# Patient Record
Sex: Female | Born: 1990
Health system: Southern US, Community
[De-identification: ages and names within clinical notes are randomized; demographics above are authoritative.]

## PROBLEM LIST (undated history)

## (undated) DIAGNOSIS — K5900 Constipation, unspecified: Secondary | ICD-10-CM

## (undated) DIAGNOSIS — O24419 Gestational diabetes mellitus in pregnancy, unspecified control: Secondary | ICD-10-CM

## (undated) HISTORY — PX: NO PAST SURGERIES: SHX2092

## (undated) HISTORY — DX: Gestational diabetes mellitus in pregnancy, unspecified control: O24.419

---

## 2020-08-01 ENCOUNTER — Other Ambulatory Visit: Payer: Self-pay

## 2020-08-01 ENCOUNTER — Encounter (HOSPITAL_COMMUNITY): Payer: Self-pay

## 2020-08-01 ENCOUNTER — Ambulatory Visit (HOSPITAL_COMMUNITY)
Admission: EM | Admit: 2020-08-01 | Discharge: 2020-08-01 | Disposition: A | Discharge status: Home / Self Care | Payer: Medicaid Other | Attending: Family Medicine | Admitting: Family Medicine

## 2020-08-01 DIAGNOSIS — K0889 Other specified disorders of teeth and supporting structures: Secondary | ICD-10-CM | POA: Diagnosis not present

## 2020-08-01 HISTORY — DX: Constipation, unspecified: K59.00

## 2020-08-01 MED ORDER — PENICILLIN V POTASSIUM 500 MG PO TABS: 500.0000 mg | ORAL_TABLET | Freq: Three times a day (TID) | ORAL | 0 refills | Status: DC

## 2020-08-01 MED ORDER — HYDROCODONE-ACETAMINOPHEN 5-325 MG PO TABS
1.0000 | ORAL_TABLET | Freq: Four times a day (QID) | ORAL | 0 refills | Status: DC | PRN
Start: 2020-08-01 — End: 2021-02-19

## 2020-08-01 NOTE — Discharge Instructions (Addendum)
Continue taking ibuprofen.  Be aware, you have been prescribed pain medications that may cause drowsiness. While taking this medication, do not take any other medications containing acetaminophen (Tylenol). Do not combine with alcohol or other illicit drugs. Please do not drive, operate heavy machinery, or take part in activities that require making important decisions while on this medication as your judgement may be clouded.

## 2020-08-01 NOTE — ED Provider Notes (Signed)
[facility]   [id number] 08/01/20 Arrival Time: 1034  ASSESSMENT & PLAN:  1. Pain, dental     No sign of abscess requiring I&D at this time. Discussed.   Meds ordered this encounter  Medications  . HYDROcodone-acetaminophen (NORCO/VICODIN) 5-325 MG tablet    Sig: Take 1 tablet by mouth every 6 (six) hours as needed for moderate pain or severe pain.    Dispense:  8 tablet    Refill:  0  . penicillin v potassium (VEETID) 500 MG tablet    Sig: Take 1 tablet (500 mg total) by mouth 3 (three) times daily.    Dispense:  30 tablet    Refill:  0     Controlled Substances Registry consulted for this patient. I feel the risk/benefit ratio today is favorable for proceeding with this prescription for a controlled substance. Medication sedation precautions given.  Reports she has a dentist appt next week.  Reviewed expectations re: course of current medical issues. Questions answered. Outlined signs and symptoms indicating need for more acute intervention. Patient verbalized understanding. After Visit Summary given.   SUBJECTIVE:  Dawn Kennedy is a 29 y.o. female who reports gradual onset of left lower rear dental pain described as "very sore". Present for weeks. Fever: absent. Tolerating PO intake but reports pain with chewing. Normal swallowing. She does not see a dentist regularly. No neck swelling or pain. OTC analgesics without relief.  OBJECTIVE: Vitals:   08/01/20 1138 08/01/20 1142  BP:  101/77  Pulse:  74  Resp:  15  Temp:  98.2 F (36.8 C)  TempSrc:  Oral  SpO2:  97%  Weight: 60 kg   Height: 5' 1.02" (1.55 m)     General appearance: alert; no distress HENT: normocephalic; atraumatic; dentition: fair; left lower rear gums without areas of fluctuance, drainage, or bleeding and with tenderness to palpation; cracked molar; normal jaw movement without difficulty Neck: supple without LAD; FROM; trachea midline Lungs: normal respirations; unlabored; speaks  full sentences without difficulty Skin: warm and dry Psychological: alert and cooperative; normal mood and affect  No Known Allergies  Past Medical History:  Diagnosis Date  . Constipated    Social History   Socioeconomic History  . Marital status: Married    Spouse name: Not on file  . Number of children: Not on file  . Years of education: Not on file  . Highest education level: Not on file  Occupational History  . Not on file  Tobacco Use  . Smoking status: Never Smoker  . Smokeless tobacco: Never Used  Substance and Sexual Activity  . Alcohol use: Never  . Drug use: Never  . Sexual activity: Not on file  Other Topics Concern  . Not on file  Social History Narrative  . Not on file   Social Determinants of Health   Financial Resource Strain:   . Difficulty of Paying Living Expenses: Not on file  Food Insecurity:   . Worried About Programme researcher, broadcasting/film/video in the Last Year: Not on file  . Ran Out of Food in the Last Year: Not on file  Transportation Needs:   . Lack of Transportation (Medical): Not on file  . Lack of Transportation (Non-Medical): Not on file  Physical Activity:   . Days of Exercise per Week: Not on file  . Minutes of Exercise per Session: Not on file  Stress:   . Feeling of Stress : Not on file  Social Connections:   . Frequency of  Communication with Friends and Family: Not on file  . Frequency of Social Gatherings with Friends and Family: Not on file  . Attends Religious Services: Not on file  . Active Member of Clubs or Organizations: Not on file  . Attends Banker Meetings: Not on file  . Marital Status: Not on file  Intimate Partner Violence:   . Fear of Current or Ex-Partner: Not on file  . Emotionally Abused: Not on file  . Physically Abused: Not on file  . Sexually Abused: Not on file   No family history on file.    Mardella Layman, MD 08/01/20 1331

## 2020-08-01 NOTE — ED Triage Notes (Signed)
Pt presents for multiple tooth aches (possibly impacted wisdom teeth). She recently came to the [place] from [place] and has not established herself with a Education officer, community, she states her case worker is trying to set up an appointment.

## 2020-09-10 ENCOUNTER — Other Ambulatory Visit: Payer: Self-pay | Admitting: Obstetrics and Gynecology

## 2020-09-10 ENCOUNTER — Ambulatory Visit
Admission: RE | Admit: 2020-09-10 | Discharge: 2020-09-10 | Disposition: A | Discharge status: Home / Self Care | Payer: No Typology Code available for payment source | Source: Ambulatory Visit | Attending: Obstetrics and Gynecology | Admitting: Obstetrics and Gynecology

## 2020-09-10 DIAGNOSIS — R7612 Nonspecific reaction to cell mediated immunity measurement of gamma interferon antigen response without active tuberculosis: Secondary | ICD-10-CM

## 2020-11-14 ENCOUNTER — Ambulatory Visit: Payer: No Typology Code available for payment source | Admitting: Family

## 2020-12-17 DIAGNOSIS — Z227 Latent tuberculosis: Secondary | ICD-10-CM

## 2020-12-17 DIAGNOSIS — Z0289 Encounter for other administrative examinations: Secondary | ICD-10-CM

## 2020-12-17 HISTORY — DX: Encounter for other administrative examinations: Z02.89

## 2020-12-17 HISTORY — DX: Latent tuberculosis: Z22.7

## 2020-12-17 NOTE — Progress Notes (Unsigned)
  Patient Name: Dawn Kennedy Date of Birth: 05-Nov-1990 Date of Visit: 12/17/20 PCP: Pcp, No  Chief Complaint: refugee intake examination and ***   The patient's preferred language is Dari. An interpreter was used for the entire visit.  Interpreter Name or ID: ***    Subjective: Dawn Kennedy is a pleasant 30 y.o. presenting today for an initial refugee and immigrant clinic visit.    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  PSH: ***   FH: ***  Allergies:  ***   Current Medications:  ***   Social History: Tobacco Use: *** Alcohol Use: ***  In the past two weeks, have you run out of food before you had money to purchase more? ***  In the past two weeks, have you had difficulty with obtaining food for your family? ***  Refugee Information     Overseas Vaccines Reviewed and Updated in Epic {yes/no:20286}   There were no vitals filed for this visit. HEENT: Sclera anicteric. Dentition is *** . Appears well hydrated. Neck: Supple Cardiac: Regular rate and rhythm. Normal S1/S2. No murmurs, rubs, or gallops appreciated. Lungs: Clear bilaterally to ascultation.  Abdomen: Normoactive bowel sounds. No tenderness to deep or light palpation. No rebound or guarding. Splenomegaly ***  Extremities: Warm, well perfused without edema.  Skin: ***  Psych: Pleasant and appropriate  MSK: ***   No problem-specific Assessment & Plan notes found for this encounter.    I have personally updated the history tabs within Epic and included the refugee information in social documentation. ***   Music therapist signed with agency ***.   Release of information signed for Health Department ***.   Return to care in 1 month in [facility] with resident physician and PCP ***.   Vaccines: ***

## 2020-12-18 ENCOUNTER — Other Ambulatory Visit (HOSPITAL_COMMUNITY): Payer: Self-pay | Admitting: Family Medicine

## 2020-12-18 ENCOUNTER — Ambulatory Visit (INDEPENDENT_AMBULATORY_CARE_PROVIDER_SITE_OTHER): Payer: Medicaid Other | Admitting: Family Medicine

## 2020-12-18 ENCOUNTER — Other Ambulatory Visit (HOSPITAL_COMMUNITY)
Admission: RE | Admit: 2020-12-18 | Discharge: 2020-12-18 | Disposition: A | Discharge status: Home / Self Care | Payer: Medicaid Other | Source: Ambulatory Visit | Attending: Family Medicine | Admitting: Family Medicine

## 2020-12-18 ENCOUNTER — Other Ambulatory Visit: Payer: Self-pay

## 2020-12-18 DIAGNOSIS — Z227 Latent tuberculosis: Secondary | ICD-10-CM

## 2020-12-18 DIAGNOSIS — K5900 Constipation, unspecified: Secondary | ICD-10-CM | POA: Diagnosis not present

## 2020-12-18 DIAGNOSIS — Z0289 Encounter for other administrative examinations: Secondary | ICD-10-CM

## 2020-12-18 LAB — POCT URINALYSIS DIP (MANUAL ENTRY)
Bilirubin, UA: NEGATIVE
Glucose, UA: NEGATIVE mg/dL
Ketones, POC UA: NEGATIVE mg/dL
Nitrite, UA: NEGATIVE
Protein Ur, POC: NEGATIVE mg/dL
Spec Grav, UA: 1.025 (ref 1.010–1.025)
Urobilinogen, UA: 0.2 E.U./dL
pH, UA: 7 (ref 5.0–8.0)

## 2020-12-18 LAB — POCT URINE PREGNANCY: Preg Test, Ur: NEGATIVE

## 2020-12-18 LAB — POCT UA - MICROSCOPIC ONLY

## 2020-12-18 MED ORDER — IVERMECTIN 3 MG PO TABS: 200.0000 ug/kg | ORAL_TABLET | Freq: Once | ORAL | 0 refills | Status: DC

## 2020-12-18 MED ORDER — ALBENDAZOLE 200 MG PO TABS: 400.0000 mg | ORAL_TABLET | Freq: Once | ORAL | 0 refills | Status: DC

## 2020-12-18 MED ORDER — ALBENDAZOLE 200 MG PO TABS: 400.0000 mg | ORAL_TABLET | Freq: Once | ORAL | 0 refills | Status: AC

## 2020-12-18 MED ORDER — POLYETHYLENE GLYCOL 3350 17 GM/SCOOP PO POWD: 17.0000 g | Freq: Every day | ORAL | 1 refills | Status: DC

## 2020-12-18 MED ORDER — IVERMECTIN 3 MG PO TABS: 200.0000 ug/kg | ORAL_TABLET | Freq: Every day | ORAL | 0 refills | Status: AC

## 2020-12-18 NOTE — Assessment & Plan Note (Addendum)
-  previous bleeding likely due to strain from constipation  -prescribed miralax  -encouraged to incorporate fiber into diet -encouraged maintaining adequate hydration and exercise -f/u in 1 month

## 2020-12-18 NOTE — Patient Instructions (Addendum)
It was wonderful to see you today.  Please bring ALL of your medications with you to every visit.   Today we talked about:  - Welcome to our clinic! - Follow-up appointment scheduled with Dr Robyne Peers on Jan 29, 2021  - We checked lab work today, we will discuss with you at follow-up appointments - We will have you start Miralax for your constipation   Thank you for choosing [facility].   Please call [phone] with any questions about today's appointment.  Please be sure to schedule follow up at the front  desk before you leave today.   Burley Saver, MD  Family Medicine

## 2020-12-18 NOTE — Assessment & Plan Note (Signed)
-  continue isoniazid and B6 treatment as prescribed

## 2020-12-18 NOTE — Progress Notes (Signed)
Patient Name: Dawn Kennedy Date of Birth: September 09, 1991 Date of Visit: 12/18/20 PCP: Pcp, No  Chief Complaint: refugee intake examination and constipation   The patient's preferred language is Dari. An interpreter was used for the entire visit.  Interpreter Name or ID: Jarvis Morgan   Subjective: Dawn Kennedy is a pleasant 30 y.o. presenting today for an initial refugee and immigrant clinic visit. Concern of constipation, has been having 1-2 BM a week for the past 7 months since arriving to the [place]. Denies abdominal pain but endorsing abdominal tightness which resolve with a BM. Maintaining hydration and physical activity.    ROS: Denies fever, chills, weight loss, night sweats, chest pain, dyspnea, nausea, vomiting, headache, vision changes, dysuria. Denies hematochezia but noted small specks of blood when wiping about 3 months ago, no blood noted since then.   PMH: -Latent TB: Started treatment with isoniazid 300 mg daily and B6 daily for 6 months. Completed 2 months of treatment thus far.  -Constipation   PSH: Tooth extraction  FH: Cardiomegaly in sister. Throat cancer in paternal uncle.   Allergies:  No known drug allergies.  Current Medications:  Isoniazid and B6   Social History: Tobacco Use: Never Alcohol Use: Never  In the past two weeks, have you run out of food before you had money to purchase more? No  In the past two weeks, have you had difficulty with obtaining food for your family? No  Refugee Information Number of Immediate Family Members: 7 Number of Immediate Family Members in [place]: 1 Date of Arrival: 05/27/20 Country of Birth: [place] Country of Origin: [place] Location of Refugee Camp: Other Other Location of Refugee Camp:: [place] Duration in [place]: 0-1 years Reason for Leaving Home Country: Political opinion Primary Language: Dari Able to Read in Primary Language: Yes Able to Write in Primary Language: Yes Education: [employer] Prior Work:  Electrical engineer Marital Status: Divorced Sexual Activity: No Tuberculosis Screening Overseas: Positive Health Department Labs Completed: Yes History of Trauma: None Do You Feel Jumpy or Nervous?: No Are You Very Watchful or 'Super Alert'?: No   Vitals:   12/18/20 1501  BP: 100/60  Pulse: 70  SpO2: 97%   HEENT: Sclera anicteric. Dentition is normal . Appears well hydrated. Neck: Supple Cardiac: Regular rate and rhythm. Normal S1/S2. No murmurs, rubs, or gallops appreciated. Lungs: Clear bilaterally to ascultation.  Abdomen: Normoactive bowel sounds. No tenderness to deep or light palpation. No rebound or guarding. No evidence of splenomegaly. Extremities: Warm, well perfused without edema.  Skin: warm and dry to touch, no rashes or lesions noted  Psych: Pleasant and appropriate  Neuro: normal gait, exhibits logical and tangential thinking process  Constipation -previous bleeding likely due to strain from constipation  -prescribed miralax  -encouraged to incorporate fiber into diet -encouraged maintaining adequate hydration and exercise -f/u in 1 month   Latent tuberculosis by blood test -continue isoniazid and B6 treatment as prescribed    I have personally updated the history tabs within Epic and included the refugee information in social documentation.   Designated Market researcher signed with agency.   Release of information signed for Health Department.   Return to care in 1 month in [facility] with resident physician and PCP on 01/29/2021.   Vaccines: updated in Epic today    I was available as preceptor in clinic. I agree with the assessment and plan as documented below.  At follow-up appointment, check vaccination records and continue with catch-up vaccinations. Also recommend WWE and discussion of  pap smear.  Burley Saver, MD

## 2020-12-19 LAB — URINE CYTOLOGY ANCILLARY ONLY
Chlamydia: NEGATIVE
Comment: NEGATIVE
Comment: NORMAL
Neisseria Gonorrhea: NEGATIVE

## 2020-12-20 LAB — HEPATITIS B SURFACE ANTIBODY, QUANTITATIVE: Hepatitis B Surf Ab Quant: >1000 m[IU]/mL (ref >9.9)

## 2020-12-20 LAB — LIPID PANEL
Chol/HDL Ratio: 4.8 ratio — ABNORMAL HIGH (ref 0.0–4.4)
Cholesterol, Total: 177 mg/dL (ref 100–199)
HDL: 37 mg/dL — ABNORMAL LOW (ref >39)
LDL Chol Calc (NIH): 121 mg/dL — ABNORMAL HIGH (ref 0–99)
Triglycerides: 104 mg/dL (ref 0–149)
VLDL Cholesterol Cal: 19 mg/dL (ref 5–40)

## 2020-12-20 LAB — CBC WITH DIFFERENTIAL/PLATELET
Basophils Absolute: 0.1 10*3/uL (ref 0.0–0.2)
Basos: 1 %
EOS (ABSOLUTE): 0.2 10*3/uL (ref 0.0–0.4)
Eos: 2 %
Hematocrit: 39 % (ref 34.0–46.6)
Hemoglobin: 13.2 g/dL (ref 11.1–15.9)
Immature Grans (Abs): 0 10*3/uL (ref 0.0–0.1)
Immature Granulocytes: 0 %
Lymphocytes Absolute: 3.3 10*3/uL — ABNORMAL HIGH (ref 0.7–3.1)
Lymphs: 35 %
MCH: 29.1 pg (ref 26.6–33.0)
MCHC: 33.8 g/dL (ref 31.5–35.7)
MCV: 86 fL (ref 79–97)
Monocytes Absolute: 0.9 10*3/uL (ref 0.1–0.9)
Monocytes: 10 %
Neutrophils Absolute: 4.9 10*3/uL (ref 1.4–7.0)
Neutrophils: 52 %
Platelets: 374 10*3/uL (ref 150–450)
RBC: 4.54 x10E6/uL (ref 3.77–5.28)
RDW: 11.8 % (ref 11.7–15.4)
WBC: 9.3 10*3/uL (ref 3.4–10.8)

## 2020-12-20 LAB — COMPREHENSIVE METABOLIC PANEL
ALT: 18 IU/L (ref 0–32)
AST: 23 IU/L (ref 0–40)
Albumin/Globulin Ratio: 1.5 (ref 1.2–2.2)
Albumin: 4.8 g/dL (ref 3.9–5.0)
Alkaline Phosphatase: 74 IU/L (ref 44–121)
BUN/Creatinine Ratio: 21 (ref 9–23)
BUN: 14 mg/dL (ref 6–20)
Bilirubin Total: 0.2 mg/dL (ref 0.0–1.2)
CO2: 23 mmol/L (ref 20–29)
Calcium: 9.6 mg/dL (ref 8.7–10.2)
Chloride: 102 mmol/L (ref 96–106)
Creatinine, Ser: 0.68 mg/dL (ref 0.57–1.00)
Globulin, Total: 3.1 g/dL (ref 1.5–4.5)
Glucose: 88 mg/dL (ref 65–99)
Potassium: 4.4 mmol/L (ref 3.5–5.2)
Sodium: 140 mmol/L (ref 134–144)
Total Protein: 7.9 g/dL (ref 6.0–8.5)
eGFR: 121 mL/min/{1.73_m2} (ref >59)

## 2020-12-20 LAB — HIV ANTIBODY (ROUTINE TESTING W REFLEX): HIV Screen 4th Generation wRfx: NONREACTIVE

## 2020-12-20 LAB — HEPATITIS B SURFACE ANTIGEN: Hepatitis B Surface Ag: NEGATIVE

## 2020-12-20 LAB — HGB FRACTIONATION CASCADE
Hgb A2: 2.6 % (ref 1.8–3.2)
Hgb A: 97.4 % (ref 96.4–98.8)
Hgb F: 0 % (ref 0.0–2.0)
Hgb S: 0 %

## 2020-12-20 LAB — HCV AB W REFLEX TO QUANT PCR: HCV Ab: <0.1 s/co ratio (ref 0.0–0.9)

## 2020-12-20 LAB — TSH: TSH: 0.963 u[IU]/mL (ref 0.450–4.500)

## 2020-12-20 LAB — VARICELLA ZOSTER ANTIBODY, IGG: Varicella zoster IgG: 1670 index (ref >165)

## 2020-12-20 LAB — RPR: RPR Ser Ql: NONREACTIVE

## 2020-12-20 LAB — HEPATITIS B CORE ANTIBODY, TOTAL: Hep B Core Total Ab: NEGATIVE

## 2020-12-20 LAB — HCV INTERPRETATION

## 2021-01-29 ENCOUNTER — Encounter: Payer: Self-pay | Admitting: Family Medicine

## 2021-01-29 ENCOUNTER — Ambulatory Visit (INDEPENDENT_AMBULATORY_CARE_PROVIDER_SITE_OTHER): Payer: Medicaid Other | Admitting: Family Medicine

## 2021-01-29 ENCOUNTER — Other Ambulatory Visit: Payer: Self-pay

## 2021-01-29 VITALS — BP 104/60 | HR 70 | Wt 135.0 lb

## 2021-01-29 DIAGNOSIS — K5900 Constipation, unspecified: Secondary | ICD-10-CM | POA: Diagnosis present

## 2021-01-29 DIAGNOSIS — Z23 Encounter for immunization: Secondary | ICD-10-CM | POA: Diagnosis not present

## 2021-01-29 DIAGNOSIS — R21 Rash and other nonspecific skin eruption: Secondary | ICD-10-CM

## 2021-01-29 DIAGNOSIS — L2089 Other atopic dermatitis: Secondary | ICD-10-CM

## 2021-01-29 DIAGNOSIS — L209 Atopic dermatitis, unspecified: Secondary | ICD-10-CM | POA: Insufficient documentation

## 2021-01-29 MED ORDER — POLYETHYLENE GLYCOL 3350 17 GM/SCOOP PO POWD
ORAL | 1 refills | Status: DC
Start: 2021-01-29 — End: 2021-01-29

## 2021-01-29 MED ORDER — POLYETHYLENE GLYCOL 3350 17 GM/SCOOP PO POWD: ORAL | 1 refills | Status: AC

## 2021-01-29 NOTE — Progress Notes (Signed)
    SUBJECTIVE:   CHIEF COMPLAINT / HPI:   Facial rash Reports one month history of facial rash that is located more prominently along the chin. Denies fever, chills and cough. Endorsing pruritus but otherwise denies other associated symptoms. Does not notice any pus or drainage from these areas but is worried that they are spreading. She has not tried any new products on her face and does not have a similar rash elsewhere on her body. No known allergies.   PERTINENT  PMH / PSH:   Constipation Reports having at least one bowel movement daily, some days endorsing multiple bowel movements. Denies abdominal pain or discomfort. Denies straining, last BM was this morning. Compliant on 1 capful of miralax daily.   OBJECTIVE:   BP 104/60   Pulse 70   Wt 135 lb (61.2 kg)   LMP 01/08/2021 (Exact Date)   SpO2 99%   BMI 24.07 kg/m   General: Patient well-appearing, in no acute distress. CV: RRR, no murmurs or gallops  Resp: CTAB, no wheezing, rales or rhonchi, breathing comfortably on room air  Abdomen: soft, nontender, nondistended, presence of active bowel sounds Ext: no LE edema, radial pulses strong and equal bilaterally  Derm: tiny, less than 1 cm pinpoint papules with surrounding erythema noted along the chin and forehead, no drainage, bleeding or pus noted  Neuro: normal gait  Psych: mood appropriate   ASSESSMENT/PLAN:   Atopic dermatitis -likely secondary to atopic dermatitis, less likely acne, low concern for infectious etiology as patient does not have rash elsewhere and lacks systemic symptoms  -instructed to wash face at least twice daily, wear clean mask daily and wash blankets and pillowcases regularly, avoid scented products  -instructed to return if worsening, otherwise follow up in 2 months   Constipation -continue miralax, instructed to adjust cap size as needed -high fiber diet  -reassurance provided    Health maintenance  -will need PAP screening -MMR and  varicella vaccinations administered today, also due to polio but not available in office   In-person Dari interpretor, [name], utilized throughout the entirety of this encounter.   Donney Dice, [place]

## 2021-01-29 NOTE — Assessment & Plan Note (Signed)
-  continue miralax, instructed to adjust cap size as needed -high fiber diet  -reassurance provided

## 2021-01-29 NOTE — Assessment & Plan Note (Signed)
-  likely secondary to atopic dermatitis, less likely acne, low concern for infectious etiology as patient does not have rash elsewhere and lacks systemic symptoms  -instructed to wash face at least twice daily, wear clean mask daily and wash blankets and pillowcases regularly, avoid scented products  -instructed to return if worsening, otherwise follow up in 2 months

## 2021-01-29 NOTE — Patient Instructions (Signed)
It was great seeing you today!  Today we discussed your constipation and rash on your face. For the constipation, I am glad it has improved. Please continue to take miralax which I have refilled and sent to your pharmacy. Take one capful a day, if needed you may take a cap and a half full if you have not had a bowel movement in more than 2 days. The rash on your face looks like it may be due to a form of eczema, please make sure to use a clean mask daily and clean all pillow cases and blankets before use. Make sure to wash your face at least twice daily. Please do not use fragrant and scented products on your face, use lotion to moisturize.   Please follow up in 2 months for your next scheduled appointment, if anything arises between now and then, please don't hesitate to contact our office.   Thank you for allowing [place] to be a part of your medical care!  Thank you, Dr. Robyne Peers

## 2021-02-19 ENCOUNTER — Ambulatory Visit (INDEPENDENT_AMBULATORY_CARE_PROVIDER_SITE_OTHER): Payer: Medicaid Other | Admitting: Family Medicine

## 2021-02-19 ENCOUNTER — Encounter: Payer: Self-pay | Admitting: Family Medicine

## 2021-02-19 ENCOUNTER — Other Ambulatory Visit: Payer: Self-pay

## 2021-02-19 VITALS — BP 104/68 | HR 78 | Ht 63.0 in | Wt 133.0 lb

## 2021-02-19 DIAGNOSIS — L219 Seborrheic dermatitis, unspecified: Secondary | ICD-10-CM | POA: Diagnosis not present

## 2021-02-19 DIAGNOSIS — R21 Rash and other nonspecific skin eruption: Secondary | ICD-10-CM | POA: Diagnosis not present

## 2021-02-19 MED ORDER — KETOCONAZOLE 2 % EX CREA: 1.0000 "application " | TOPICAL_CREAM | Freq: Every day | CUTANEOUS | 0 refills | Status: DC

## 2021-02-19 NOTE — Progress Notes (Signed)
    SUBJECTIVE:   CHIEF COMPLAINT / HPI:   Face Rash Present x2 months. Waxes and wanes in severity but is always there. Sometimes itchy, sometimes painful. She previously used olive oil on the rash, which was not helpful so she stopped. She washes her face once daily and uses Olay face lotion daily. She changes her mask daily. No new products including makeup, detergent, foods or medications. 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  PERTINENT  PMH / PSH: none  OBJECTIVE:   BP 104/68   Pulse 78   Ht 5\' 3"  (1.6 m)   Wt 133 lb (60.3 kg)   SpO2 98%   BMI 23.56 kg/m   Gen: alert, well-appearing, NAD Skin: erythematous papules on bilateral nasolabial folds and left chin. No vesicles. No scaling. No rash elsewhere on the body,     ASSESSMENT/PLAN:   Rash of face Most likely seborrheic dermatitis of the nasolabial folds given appearance and location of rash on exam. Findings not consistent with atopic dermatitis. No vesicles to suggest herpetic etiology.  -Trial of ketoconazole 2% cream daily x2-3 weeks -If no improvement, would recommend low potency topical steroid   In-person Dari interpreter, [name], used for the entirety of this encounter.   , MD [facility]

## 2021-02-19 NOTE — Assessment & Plan Note (Signed)
Most likely seborrheic dermatitis of the nasolabial folds given appearance and location of rash on exam. Findings not consistent with atopic dermatitis. No vesicles to suggest herpetic etiology.  -Trial of ketoconazole 2% cream daily x2-3 weeks -If no improvement, would recommend low potency topical steroid

## 2021-02-19 NOTE — Patient Instructions (Addendum)
It was great to meet you!  For your face rash: - Try using ketoconazole cream daily for the next few weeks. I have sent this to your pharmacy. - If this is not helpful, we can also try a steroid cream in the future.  Take care and seek immediate care sooner if you develop any concerns.  Dr. Est[facility]

## 2021-03-07 ENCOUNTER — Other Ambulatory Visit: Payer: Self-pay

## 2021-03-07 ENCOUNTER — Ambulatory Visit (INDEPENDENT_AMBULATORY_CARE_PROVIDER_SITE_OTHER): Payer: Medicaid Other | Admitting: Family Medicine

## 2021-03-07 ENCOUNTER — Encounter: Payer: Self-pay | Admitting: Family Medicine

## 2021-03-07 VITALS — BP 100/58 | HR 74 | Ht 63.0 in | Wt 134.8 lb

## 2021-03-07 DIAGNOSIS — L219 Seborrheic dermatitis, unspecified: Secondary | ICD-10-CM

## 2021-03-07 DIAGNOSIS — M222X1 Patellofemoral disorders, right knee: Secondary | ICD-10-CM

## 2021-03-07 HISTORY — DX: Patellofemoral disorders, right knee: M22.2X1

## 2021-03-07 HISTORY — DX: Seborrheic dermatitis, unspecified: L21.9

## 2021-03-07 MED ORDER — HYDROCORTISONE 2.5 % EX CREA: TOPICAL_CREAM | Freq: Two times a day (BID) | CUTANEOUS | 0 refills | Status: DC

## 2021-03-07 NOTE — Progress Notes (Signed)
    SUBJECTIVE:   CHIEF COMPLAINT / HPI:   Facial rash Endorsing rash that has been ongoing for more than a month. States that it is often intermittent and she cannot determine any specific cause. Patient washes her face multiple times a day and tries to change her mask throughout the day as well since she has to sometimes wear 2 masks. Denies any use of any new products. She is unsure of what factors specifically make it better or worse. Given ketoconazole previously which has not fully resolved the rash. Dermatology referral placed with at previous visit with last provider.  Right knee pain Seems to be chronic in nature, notes that it has been ongoing for about a year. Denies any trauma or inciting event. Describes it as a dull, aching pain without radiating qualities. Denies any associated rash or other symptoms. Sometimes is painful, at its worse is a 6/10 which seems to be painful along the entire knee with no specific area. Resting improves the pain and walking long distances worsens the pain. Ibuprofen helps resolve the pain as well. She denies running regularly but does work out occasionally, states that her pain and discomfort does not interfere with her being able to work out.   OBJECTIVE:   BP (!) 100/58   Pulse 74   Ht 5\' 3"  (1.6 m)   Wt 134 lb 12.8 oz (61.1 kg)   LMP 03/01/2021   SpO2 98%   BMI 23.88 kg/m   General: Patient well-appearing, in no acute distress. HEENT: normocephalic, atraumatic, erythematous rash noted more prominently along nasolabial folds without pus, ulceration or drainage  CV: RRR, no murmurs or gallops auscultated Resp: CTAB Abdomen: soft, nontender, presence of bowel sounds MSK: normal active and passive ROM along knees and ankles bilaterally, no rashes or crepitus noted, no point tenderness on palpation of patella or ligament Neuro: normal gait  ASSESSMENT/PLAN:   Seborrheic dermatitis -consistent with seborrheic dermatitis, prescribed  hydrocortisone cream  -dermatology referral already placed, explained to patient that this may take some time  Patellofemoral syndrome of right knee -exercises discussed and given, handout given -ibuprofen as needed -f/u as appropriate   Dawn Kennedy, in-person interpretor utilized throughout the entirety of this encounter  05/01/2021, DO [facility]

## 2021-03-07 NOTE — Assessment & Plan Note (Signed)
-  consistent with seborrheic dermatitis, prescribed hydrocortisone cream  -dermatology referral already placed, explained to patient that this may take some time

## 2021-03-07 NOTE — Assessment & Plan Note (Addendum)
-  exercises discussed and given, handout given -ibuprofen as needed -f/u as appropriate

## 2021-03-07 NOTE — Patient Instructions (Signed)
It was great seeing you today!  Today we discussed your rash on the face which I have prescribed some cream called hydrocortisone to help, please apply this on the affected area.   For your knee, please do stretches daily, if possible multiple times a day to alleviate the pain and discomfort. Mainly focusing on the types of stretches that we discussed.   Please follow up at your next scheduled appointment in 1 month, if anything arises between now and then, please don't hesitate to contact our office.   Thank you for allowing [place] to be a part of your medical care!  Thank you, Dr. Robyne Peers   Patellofemoral Pain Syndrome  Patellofemoral pain syndrome is a condition in which the tissue (cartilage) on the underside of the kneecap (patella) softens or breaks down. This causes pain in the front of the knee. The condition is also called runner's knee or chondromalacia patella. Patellofemoral pain syndrome is most common in young adults who are active insports. The knee is the largest joint in the body. The patella covers the front of the knee and is attached to muscles above and below the knee. The underside of the patella is covered with a smooth type of cartilage (synovium). The smooth surface helps the patella glide easily when you move your knee. Patellofemoral pain syndrome causes swelling in the joint linings and bonesurfaces in the knee. What are the causes? This condition may be caused by: Overuse of the knee. Poor alignment of your knee joints. Weak leg muscles. A direct hit to your kneecap. What increases the risk? You are more likely to develop this condition if: You do a lot of activities that can wear down your kneecap. These include: Running. Squatting. Climbing stairs. You start a new physical activity or exercise program. You wear shoes that do not fit well. You do not have good leg strength. You are overweight. What are the signs or symptoms? The main symptom of this  condition is knee pain. This may feel like a dull, aching pain underneath your patella, in the front of your knee. There may be a popping or cracking sound when you move your knee. Pain may get worse with: Exercise. Climbing stairs. Running. Jumping. Squatting. Kneeling. Sitting for a long time. Moving or pushing on your patella. How is this diagnosed? This condition may be diagnosed based on: Your symptoms and medical history. You may be asked about your recent physical activities and which ones cause knee pain. A physical exam. This may include: Moving your patella back and forth. Checking your range of knee motion. Having you squat or jump to see if you have pain. Checking the strength of your leg muscles. Imaging tests to confirm the diagnosis. These may include an MRI of your knee. How is this treated? This condition may be treated at home with rest, ice, compression, andelevation (RICE).  Other treatments may include: NSAIDs, such as ibuprofen. Physical therapy to stretch and strengthen your leg muscles. Shoe inserts (orthotics) to take stress off your knee. A knee brace or knee support. Adhesive tapes to the skin. Surgery to remove damaged cartilage or move the patella to a better position. This is rare. Follow these instructions at home: If you have a brace: Wear the brace as told by your health care provider. Remove it only as told by your health care provider. Loosen the brace if your toes tingle, become numb, or turn cold and blue. Keep the brace clean. If the brace is not waterproof:  Do not let it get wet. Cover it with a watertight covering when you take a bath or a shower. Managing pain, stiffness, and swelling  If directed, put ice on the painful area. To do this: If you have a removable brace, remove it as told by your health care provider. Put ice in a plastic bag. Place a towel between your skin and the bag. Leave the ice on for 20 minutes, 2-3 times a  day. Remove the ice if your skin turns bright red. This is very important. If you cannot feel pain, heat, or cold, you have a greater risk of damage to the area. Move your toes often to reduce stiffness and swelling. Raise (elevate) the injured area above the level of your heart while you are sitting or lying down.  Activity Rest your knee. Avoid activities that cause knee pain. Perform stretching and strengthening exercises as told by your health care provider or physical therapist. Return to your normal activities as told by your health care provider. Ask your health care provider what activities are safe for you. General instructions Take over-the-counter and prescription medicines only as told by your health care provider. Use splints, braces, knee supports, or walking aids as directed by your health care provider. Do not use any products that contain nicotine or tobacco, such as cigarettes, e-cigarettes, and chewing tobacco. These can delay healing. If you need help quitting, ask your health care provider. Keep all follow-up visits. This is important. Contact a health care provider if: Your symptoms get worse. You are not improving with home care. Summary Patellofemoral pain syndrome is a condition in which the tissue (cartilage) on the underside of the kneecap (patella) softens or breaks down. This condition causes swelling in the joint linings and bone surfaces in the knee. This leads to pain in the front of the knee. This condition may be treated at home with rest, ice, compression, and elevation (RICE). Use splints, braces, knee supports, or walking aids as directed by your health care provider. This information is not intended to replace advice given to you by your health care provider. Make sure you discuss any questions you have with your healthcare provider. Document Revised: 02/22/2020 Document Reviewed: 02/22/2020 Elsevier Patient Education  2022 [employer] Inc[employer]    Hip  Exercises Ask your health care provider which exercises are safe for you. Do exercises exactly as told by your health care provider and adjust them as directed. It is normal to feel mild stretching, pulling, tightness, or discomfort as you do these exercises. Stop right away if you feel sudden pain or your pain gets worse. Do not begin these exercises until told by your health care provider. Stretching and range-of-motion exercises These exercises warm up your muscles and joints and improve the movement and flexibility of your hip. These exercises also help to relieve pain, numbness, and tingling. You may be asked to limit your range of motion if you had a hipreplacement. Talk to your health care provider about these restrictions. Hamstrings, supine  Lie on your back (supine position). Loop a belt or towel over the ball of your left / right foot. The ball of your foot is on the walking surface, right under your toes. Straighten your left / right knee and slowly pull on the belt or towel to raise your leg until you feel a gentle stretch behind your knee (hamstring). Do not let your knee bend while you do this. Keep your other leg flat on the floor. Hold  this position for __________ seconds. Slowly return your leg to the starting position. Repeat __________ times. Complete this exercise __________ times a day. Hip rotation  Lie on your back on a firm surface. With your left / right hand, gently pull your left / right knee toward the shoulder that is on the same side of the body. Stop when your knee is pointing toward the ceiling. Hold your left / right ankle with your other hand. Keeping your knee steady, gently pull your left / right ankle toward your other shoulder until you feel a stretch in your buttocks. Keep your hips and shoulders firmly planted while you do this stretch. Hold this position for __________ seconds. Repeat __________ times. Complete this exercise __________ times a  day. Seated stretch This exercise is sometimes called hamstrings and adductors stretch. Sit on the floor with your legs stretched wide. Keep your knees straight during this exercise. Keeping your head and back in a straight line, bend at your waist to reach for your left foot (position A). You should feel a stretch in your right inner thigh (adductors). Hold this position for __________ seconds. Then slowly return to the upright position. Keeping your head and back in a straight line, bend at your waist to reach forward (position B). You should feel a stretch behind both of your thighs and knees (hamstrings). Hold this position for __________ seconds. Then slowly return to the upright position. Keeping your head and back in a straight line, bend at your waist to reach for your right foot (position C). You should feel a stretch in your left inner thigh (adductors). Hold this position for __________ seconds. Then slowly return to the upright position. Repeat __________ times. Complete this exercise __________ times a day. Lunge This exercise stretches the muscles of the hip (hip flexors). Place your left / right knee on the floor and bend your other knee so that is directly over your ankle. You should be half-kneeling. Keep good posture with your head over your shoulders. Tighten your buttocks to point your tailbone downward. This will prevent your back from arching too much. You should feel a gentle stretch in the front of your left / right thigh and hip. If you do not feel a stretch, slide your other foot forward slightly and then slowly lunge forward with your chest up until your knee once again lines up over your ankle. Make sure your tailbone continues to point downward. Hold this position for __________ seconds. Slowly return to the starting position. Repeat __________ times. Complete this exercise __________ times a day. Strengthening exercises These exercises build strength and endurance  in your hip. Endurance is theability to use your muscles for a long time, even after they get tired. Bridge This exercise strengthens the muscles of your hip (hip extensors). Lie on your back on a firm surface with your knees bent and your feet flat on the floor. Tighten your buttocks muscles and lift your bottom off the floor until the trunk of your body and your hips are level with your thighs. Do not arch your back. You should feel the muscles working in your buttocks and the back of your thighs. If you do not feel these muscles, slide your feet 1-2 inches (2.5-5 cm) farther away from your buttocks. Hold this position for __________ seconds. Slowly lower your hips to the starting position. Let your muscles relax completely between repetitions. Repeat __________ times. Complete this exercise __________ times a day. Straight leg raises, side-lying This  exercise strengthens the muscles that move the hip joint away from the center of the body (hip abductors). Lie on your side with your left / right leg in the top position. Lie so your head, shoulder, hip, and knee line up. You may bend your bottom knee slightly to help you balance. Roll your hips slightly forward, so your hips are stacked directly over each other and your left / right knee is facing forward. Leading with your heel, lift your top leg 4-6 inches (10-15 cm). You should feel the muscles in your top hip lifting. Do not let your foot drift forward. Do not let your knee roll toward the ceiling. Hold this position for __________ seconds. Slowly return to the starting position. Let your muscles relax completely between repetitions. Repeat __________ times. Complete this exercise __________ times a day. Straight leg raises, side-lying This exercise strengthens the muscles that move the hip joint toward the center of the body (hip adductors). Lie on your side with your left / right leg in the bottom position. Lie so your head, shoulder,  hip, and knee line up. You may place your upper foot in front to help you balance. Roll your hips slightly forward, so your hips are stacked directly over each other and your left / right knee is facing forward. Tense the muscles in your inner thigh and lift your bottom leg 4-6 inches (10-15 cm). Hold this position for __________ seconds. Slowly return to the starting position. Let your muscles relax completely between repetitions. Repeat __________ times. Complete this exercise __________ times a day. Straight leg raises, supine This exercise strengthens the muscles in the front of your thigh (quadriceps). Lie on your back (supine position) with your left / right leg extended and your other knee bent. Tense the muscles in the front of your left / right thigh. You should see your kneecap slide up or see increased dimpling just above your knee. Keep these muscles tight as you raise your leg 4-6 inches (10-15 cm) off the floor. Do not let your knee bend. Hold this position for __________ seconds. Keep these muscles tense as you lower your leg. Relax the muscles slowly and completely between repetitions. Repeat __________ times. Complete this exercise __________ times a day. Hip abductors, standing This exercise strengthens the muscles that move the leg and hip joint away from the center of the body (hip abductors). Tie one end of a rubber exercise band or tubing to a secure surface, such as a chair, table, or pole. Loop the other end of the band or tubing around your left / right ankle. Keeping your ankle with the band or tubing directly opposite the secured end, step away until there is tension in the tubing or band. Hold on to a chair, table, or pole as needed for balance. Lift your left / right leg out to your side. While you do this: Keep your back upright. Keep your shoulders over your hips. Keep your toes pointing forward. Make sure to use your hip muscles to slowly lift your leg. Do not  tip your body or forcefully lift your leg. Hold this position for __________ seconds. Slowly return to the starting position. Repeat __________ times. Complete this exercise __________ times a day. Squats This exercise strengthens the muscles in the front of your thigh (quadriceps). Stand in a door frame so your feet and knees are in line with the frame. You may place your hands on the frame for balance. Slowly bend your knees and  lower your hips like you are going to sit in a chair. Keep your lower legs in a straight-up-and-down position. Do not let your hips go lower than your knees. Do not bend your knees lower than told by your health care provider. If your hip pain increases, do not bend as low. Hold this position for ___________ seconds. Slowly push with your legs to return to standing. Do not use your hands to pull yourself to standing. Repeat __________ times. Complete this exercise __________ times a day. This information is not intended to replace advice given to you by your health care provider. Make sure you discuss any questions you have with your healthcare provider. Document Revised: 04/14/2019 Document Reviewed: 07/20/2018 Elsevier Patient Education  2022 [employer].

## 2021-07-30 ENCOUNTER — Encounter: Payer: Self-pay | Admitting: *Deleted

## 2021-07-31 NOTE — Congregational Nurse Program (Signed)
  Dept: [phone]   Congregational Nurse Program Note  Date of Encounter: 07/30/2021  Past Medical History: Past Medical History:  Diagnosis Date   Constipated     Encounter Details:  CNP Questionnaire - 07/30/21 1945       Questionnaire   Do you give verbal consent to treat you today? Yes    Location Patient [place]    Visit Setting Home    Patient Status Refugee    Insurance Private or [place] Referral N/A    Medication N/A    Medical Provider Yes    Medical Referral N/A    Medical Appointment Made N/A    Food Have Food Insecurities    Transportation Need transportation assistance    Housing/Utilities No permanent housing    Interpersonal Safety N/A    Intervention Advocate;Educate;Case Management    ED Visit Averted N/A    Life-Saving Intervention Made N/A            Met with client in her apartment home.  Client would like assistance with food stamp application for her uncle and family.  This CN will procure food stamp application for client.  Client would like to go to apply for food stamps this Friday 11/11 at 0900.  This CN will meet client at their home and provide transportation to apply for food stamps in person.  Will follow up as needed.  Karene Fry, RN, MSN, [place] [facility] [phone] Cell

## 2021-11-22 ENCOUNTER — Ambulatory Visit (INDEPENDENT_AMBULATORY_CARE_PROVIDER_SITE_OTHER): Payer: BC Managed Care – PPO | Admitting: Family Medicine

## 2021-11-22 ENCOUNTER — Other Ambulatory Visit: Payer: Self-pay

## 2021-11-22 ENCOUNTER — Ambulatory Visit (INDEPENDENT_AMBULATORY_CARE_PROVIDER_SITE_OTHER): Payer: BC Managed Care – PPO

## 2021-11-22 ENCOUNTER — Encounter: Payer: Self-pay | Admitting: Family Medicine

## 2021-11-22 VITALS — BP 100/60 | HR 68 | Ht 63.0 in | Wt 122.6 lb

## 2021-11-22 DIAGNOSIS — Z0000 Encounter for general adult medical examination without abnormal findings: Secondary | ICD-10-CM | POA: Insufficient documentation

## 2021-11-22 DIAGNOSIS — Z23 Encounter for immunization: Secondary | ICD-10-CM | POA: Diagnosis not present

## 2021-11-22 HISTORY — DX: Encounter for general adult medical examination without abnormal findings: Z00.00

## 2021-11-22 NOTE — Progress Notes (Signed)
? ? ?  SUBJECTIVE:  ? ?CHIEF COMPLAINT / HPI:  ? ?Patient presents for physical and denies any concerns. She has been doing well, she recently got engaged. Works at a [employer], her goal is to go back to nursing school. Denies any concerns today.  ? ?OBJECTIVE:  ? ?BP 100/60   Pulse 68   Ht 5\' 3"  (1.6 m)   Wt 122 lb 9.6 oz (55.6 kg)   LMP 11/22/2021   SpO2 98%   BMI 21.72 kg/m?   ?General: Patient well-appearing, in no acute distress.  ?HEENT: PERRLA, normal buccal mucosa, nontender thyroid, non cervical LAD ?CV: RRR, no murmurs or gallops auscultated ?Resp: CTAB, no wheezing, rales or rhonchi noted ?Abdomen: soft, nontender, nondistended, presence of bowel sounds ?Ext: no LE edema noted bilaterally ?Neuro: normal gait ?Psych: mood appropriate, very pleasant  ? ?ASSESSMENT/PLAN:  ? ?Well woman exam (no gynecological exam) ?-Extensively discussed the importance of routine PAP smear as screening tool for cervical cancer detection, patient will schedule appointment at her convenience as she is due for a PAP ?-COVID vaccine administered today ?-med rec reviewed  ?-follow up within 1-2 months for PAP smear, otherwise follow up in 1 year for next physical or sooner as appropriate  ?  ? ?In-person Dari interpretation utilized throughout the entirety of this encounter  ? ?01/22/2022, DO ?[facility]  ?

## 2021-11-22 NOTE — Assessment & Plan Note (Signed)
-  Extensively discussed the importance of routine PAP smear as screening tool for cervical cancer detection, patient will schedule appointment at her convenience as she is due for a PAP ?-COVID vaccine administered today ?-med rec reviewed  ?-follow up within 1-2 months for PAP smear, otherwise follow up in 1 year for next physical or sooner as appropriate  ?

## 2021-11-22 NOTE — Patient Instructions (Signed)
It was great seeing you today! ? ?I am glad you are doing well!  ? ?Please follow up at your next scheduled appointment at your earliest convenience to get a PAP smear. The PAP smear is a screening tool to test for cervical cancer so that we can detect it earlier. If anything arises between now and then, please don't hesitate to contact our office. ? ? ?Thank you for allowing [place] to be a part of your medical care! ? ?Thank you, ?Dr. Robyne Peers  ?

## 2022-02-05 ENCOUNTER — Encounter: Payer: Self-pay | Admitting: Family Medicine

## 2022-02-05 ENCOUNTER — Ambulatory Visit (INDEPENDENT_AMBULATORY_CARE_PROVIDER_SITE_OTHER): Payer: BC Managed Care – PPO | Admitting: Family Medicine

## 2022-02-05 VITALS — BP 100/70 | HR 68 | Temp 98.6°F | Wt 123.0 lb

## 2022-02-05 DIAGNOSIS — Z30011 Encounter for initial prescription of contraceptive pills: Secondary | ICD-10-CM | POA: Diagnosis not present

## 2022-02-05 LAB — POCT URINE PREGNANCY: Preg Test, Ur: NEGATIVE

## 2022-02-05 MED ORDER — NORGESTIMATE-ETH ESTRADIOL 0.25-35 MG-MCG PO TABS: 1.0000 | ORAL_TABLET | Freq: Every day | ORAL | 11 refills | Status: DC

## 2022-02-05 NOTE — Progress Notes (Signed)
I was personally present and re-performed the exam and MDM and verified the service and findings are accurately documented in the student's note. ?Pharaoh Pio Autry-Lott, DO ?02/05/22 ?6:05 PM ? ?

## 2022-02-05 NOTE — Patient Instructions (Signed)
-  consider returning for pap smear ?-you can start your birth control today  ?

## 2022-02-05 NOTE — Progress Notes (Signed)
?  Date of Visit: 02/05/2022  ? ?SUBJECTIVE:  ? ?HPI: ? ?Ms. Fulginiti presents today to request a prescription for birth control pills. She does not smoke, has no history of migraines, and no history or family history of blood clots. She has no family history of cervical cancer. She has never received a pap and declines one again today. She would like to prevent pregnancy in the near term but desires to become pregnant in the future.  ? ? ?OBJECTIVE:  ? ?BP 100/70   Pulse 68   Temp 98.6 ?F (37 ?C)   Wt 123 lb (55.8 kg)   LMP 02/05/2022   SpO2 97%   BMI 21.79 kg/m?  ?Gen: well-appearing ?HEENT: NCAT ?Lungs: Normal work of breathing ?Neuro: Alert ?Ext: Grossly  normal movement of all extremities ? ?ASSESSMENT/PLAN:  ? ?Health maintenance:  ?-Counseled on the importance of cervical cancer screening ?-Provided counseling on range of available birth control options and ordered birth control pill prescription per patient request.  ? ?FOLLOW UP: ?Follow up in the next 3 months to discuss pap smear ? ?Mariah Milling, Medical Student ?[facility] ? ?I was personally present and re-performed the exam and MDM and verified the service and findings are accurately documented in the student's note. ?Simone Autry-Lott, DO ?02/05/22 ?6:04 PM ? ? ?

## 2022-03-16 IMAGING — CR DG CHEST 2V
2 series · 2 of 2 positions shown · non-contrast
Comparison: None.

CLINICAL DATA: Positive QuantiFERON-TB Gold test

EXAM:
CHEST - 2 VIEW

[w chest pa]
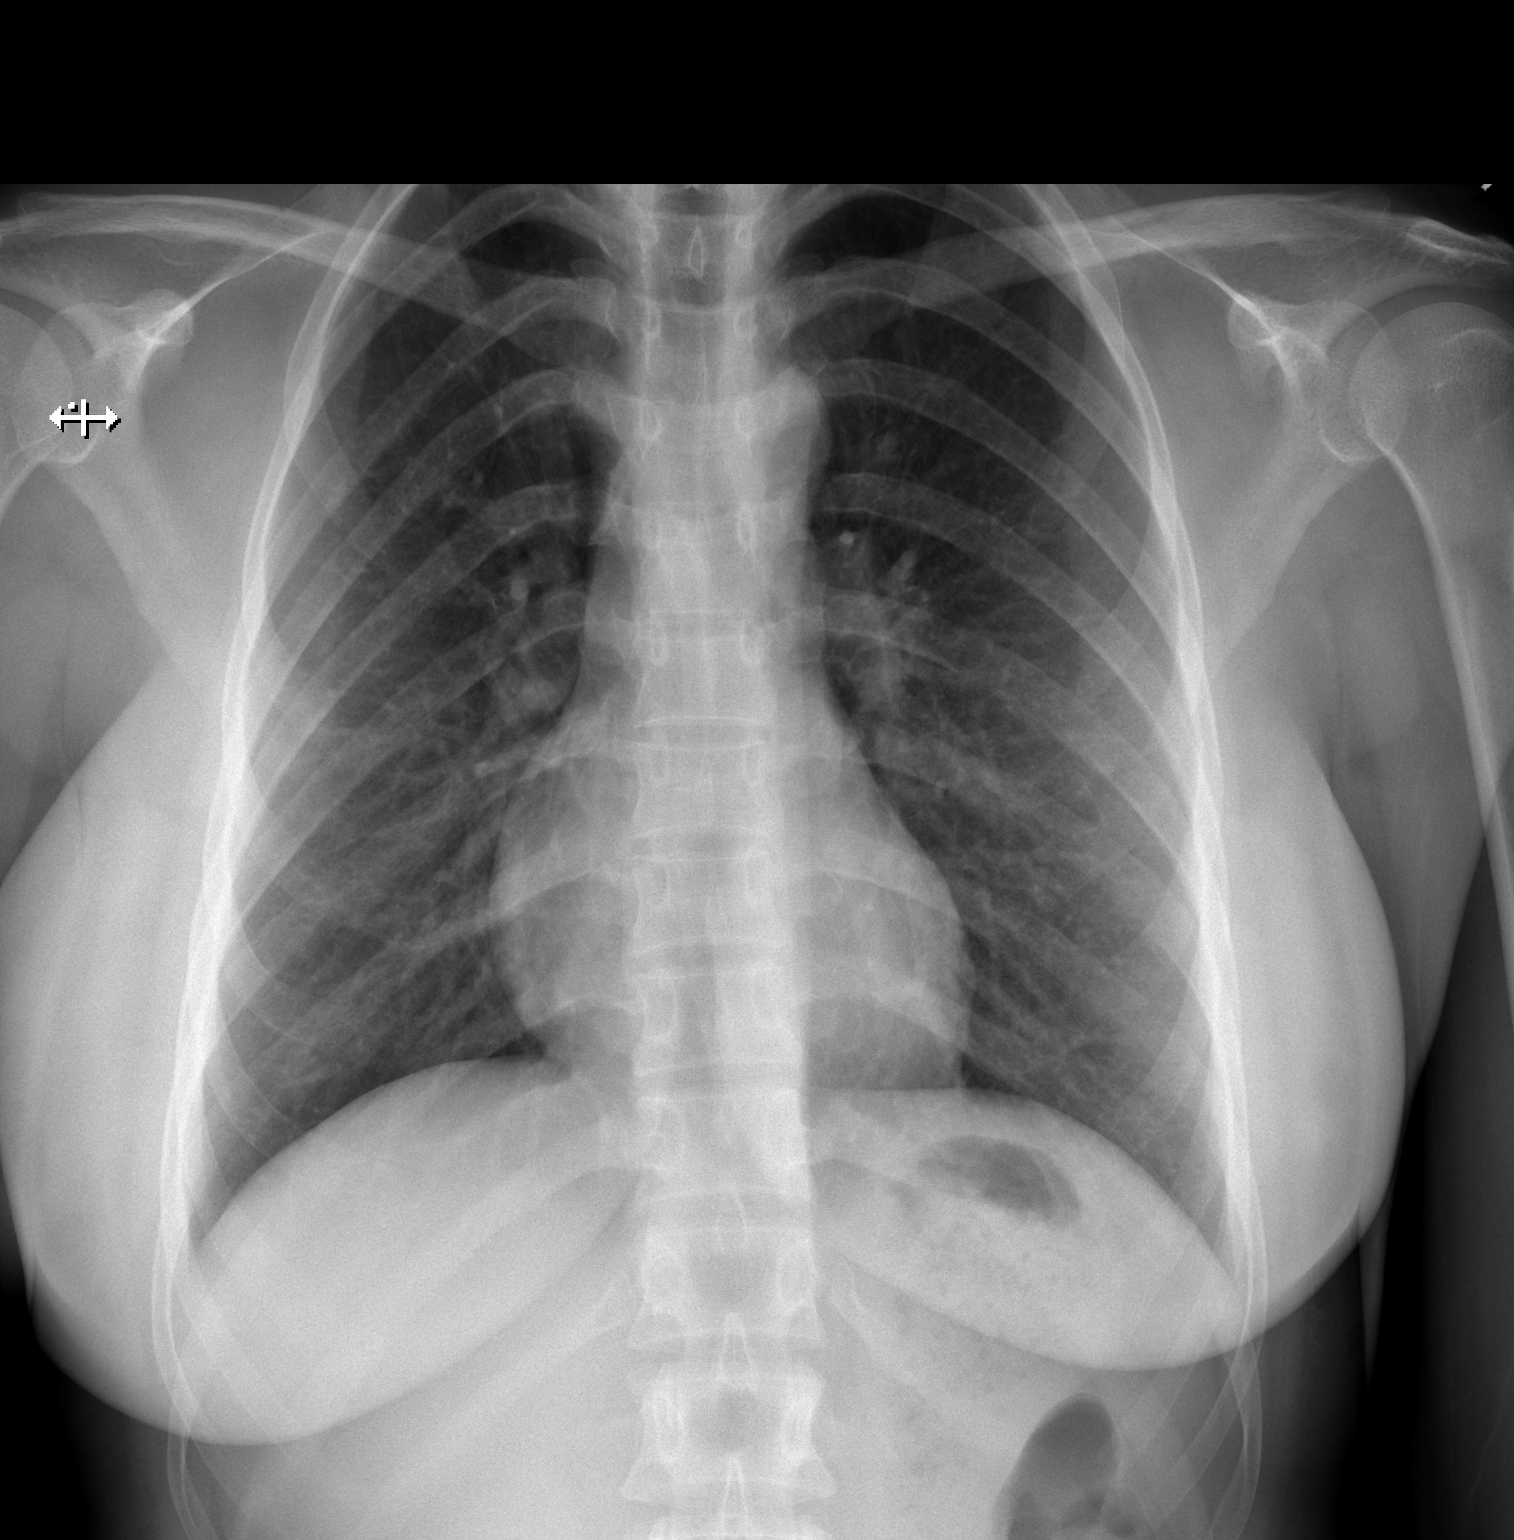

[w chest lat]
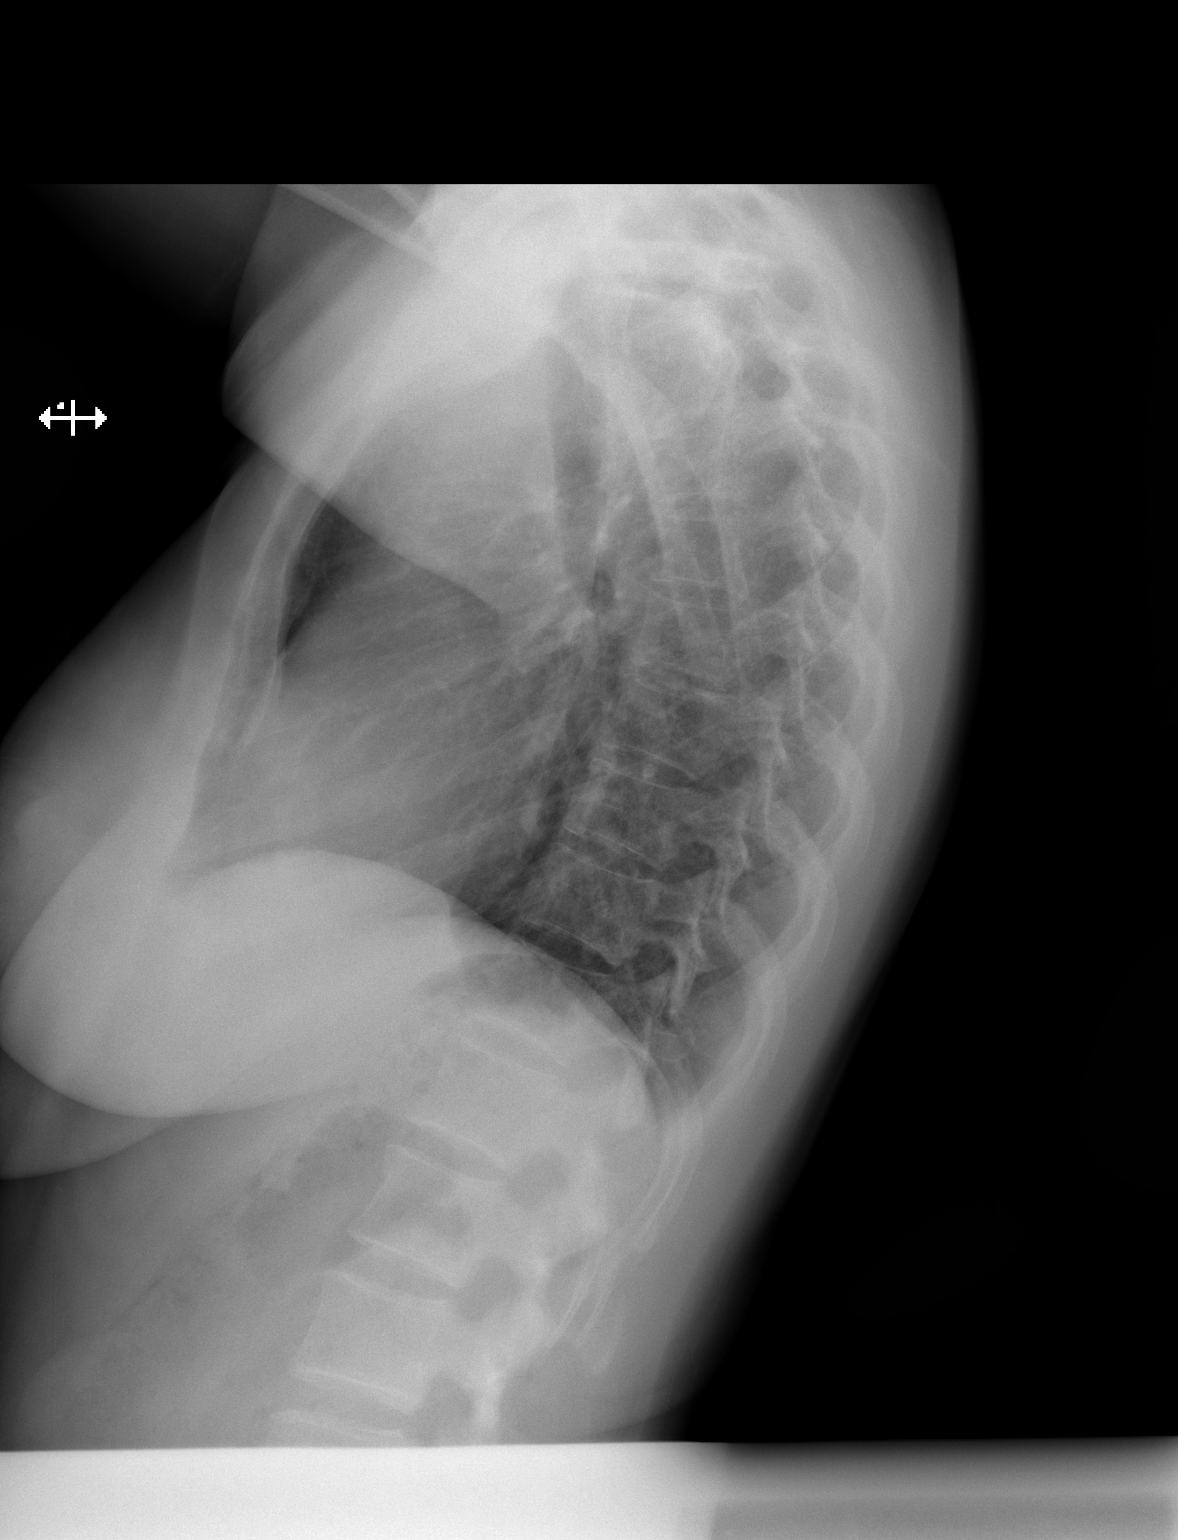

[2 of 2 positions shown; findings below may reference images not displayed]

FINDINGS: No focal consolidation. No pneumothorax or pleural effusion.
Cardiomediastinal silhouette is within normal limits. No acute
osseous abnormality.
IMPRESSION: No focal airspace disease.

## 2022-04-03 ENCOUNTER — Ambulatory Visit (INDEPENDENT_AMBULATORY_CARE_PROVIDER_SITE_OTHER): Payer: BC Managed Care – PPO | Admitting: Family Medicine

## 2022-04-03 VITALS — BP 102/68 | HR 64 | Ht 63.0 in | Wt 129.4 lb

## 2022-04-03 DIAGNOSIS — Z3200 Encounter for pregnancy test, result unknown: Secondary | ICD-10-CM | POA: Diagnosis not present

## 2022-04-03 DIAGNOSIS — Z3490 Encounter for supervision of normal pregnancy, unspecified, unspecified trimester: Secondary | ICD-10-CM

## 2022-04-03 LAB — POCT URINE PREGNANCY: Preg Test, Ur: POSITIVE — AB

## 2022-04-03 NOTE — Progress Notes (Signed)
    SUBJECTIVE:   CHIEF COMPLAINT / HPI:   Patient presents to check for pregnancy. In person interpreter present   She endorses feeling some very mild discomfort. Believes LMP was first week of June . Has not checked a pregnancy test at home. Discussed her positive test in the clinic and she has mixed emotions. States she just got married, husband not wanting kids at this time and wants her to have an abortion. Interpreter states patient would prefer to carry out her pregnancy. They want to know if an abortion would hurt chances at future pregnancies. Wanting to know abortion laws and cost involved.   PERTINENT  PMH / PSH: Reviewed   OBJECTIVE:   BP 102/68   Pulse 64   Ht 5\' 3"  (1.6 m)   Wt 129 lb 6.4 oz (58.7 kg)   LMP  (LMP Unknown)   SpO2 100%   BMI 22.92 kg/m    Physical exam General: well appearing, NAD Cardiovascular: RRR, no murmurs Lungs: CTAB. Normal WOB Abdomen: soft, non-distended Skin: warm, dry. No edema  ASSESSMENT/PLAN:   Pregnancy Patient presented to check pregnancy which was positive.  Patient inquiring about terminating pregnancy due to recently getting married and husband not desiring at this time. Provided support and a listening ear. Gave her information on the clinics that perform abortions. Recommended follow up with either after her abortion if she chooses to go that route, or for her initial OB if she decides to keep baby.     [place], DO [facility]

## 2022-04-03 NOTE — Patient Instructions (Addendum)
It was great seeing you today!  You came in to confirm pregnancy which was positive.   Below are the clinics that could give you more information if you do decide on abortion:  [facility] [place]:[address] [place], [place] [address]. [phone]  [facility]: [address] [place], [place], [place] [address]. [phone]  Please schedule to return for evaluation either after the abortion, or if you decide to keep the baby, but if you need to be seen earlier than that for any new issues we're happy to fit you in, just give [place] a call!  Feel free to call with any questions or concerns at any time, at [phone].   Take care,  Dr. Cora Collum [facility]  ????? ?? ????? ?????? ???  ??? ????? ?? ??????? ?? ????? ???? ?? ???? ???.  ?? ??? ?????? ???? ?? ?? ?????? ??????? ?????? ?? ???? ??? ???? ????? ?????? ?? ??? ?? ????: ?????? ????? ?????????:[address] ???? ??????? ?????????? ?? ?? [phone]. [phone] ??? ? ???? ?????? ???? ??? ??????? ????: [address] ?????? ???? ??? ?[address]. [address]? ??????? ????? ?? ?? [phone]. [phone]  ???? ?????? ???? ??????? ?? ?? ?? ??? ???? ??????? ? ?? ??? ??? ????? ?? ??? ????? ?????? ??? ??? ??? ???? ?? ????? ?? ?? ???? ?? ???? ????? ???? ???? ?? ??? ?? ???????? ?? ??? ?? ?? ??? ??? ?? ?? ?? ???? ??????!  ????? ?????? ???? ???? ?? ?? ???? ???? ?? ?????? ?? ?? ????? ?? [phone].  ????? ??? ???? ???????? ?? ??? ???? ????? ??????? ????? ???    Medication Abortion Medication abortion is a procedure that uses medicines to end a pregnancy. Hormone-like medicines are used to start labor by making the uterus tighten Designer, television/film set). Medication abortions are noninvasive and can be done during the first trimester of pregnancy. Tell your health care provider about: Any allergies you have. All medicines you are taking, including vitamins, herbs, eye drops, creams, and over-the-counter medicines. Any blood  disorders you have. If you have an intrauterine device (IUD) for birth control. Any medical conditions you have. What are the risks? Generally, this is a safe procedure. However, problems may occur, including: Parts of the placenta and other afterbirth may remain in your uterus. If this happens, you may need to have a procedure where the inside of your uterus is scraped to remove it. Heavy bleeding. Infection. Failure to end the pregnancy. If this happens, your health provider will discuss possible risks and other ways of treatment. Temporary side effects from the medicine. These may include: Headache. Nausea and vomiting. Diarrhea. Feeling dizzy. Fever. Chills. What happens before the procedure?  You may have a pelvic exam. This is an exam of your outer and inner genitals and reproductive organs. You may have a blood test to confirm that you are pregnant. You may have an ultrasound of your uterus. After discussing the procedure with your health care provider, you will be asked to sign a consent form. Ask your health care provider about changing or stopping your regular medicines. This is especially important if you are taking diabetes medicines or blood thinners. What happens during the procedure? You will take medicines in tablet form by mouth. After you take the medicines, you will be able to go home. The medicine will induce bleeding that may last for a few hours or up to a few days, similar to a heavy menstrual period. Within 1-3 days of  taking this medicine, you will be instructed to take more medicine by mouth or by putting it directly into your vagina (suppository). Follow your health care provider's instructions carefully. If you are Rh negative, you will be given Rh immunoglobulin to prevent Rh sensitization. The procedure may vary among health care providers and hospitals. What happens after the procedure? You may have cramps for a few days. They may feel like menstrual  cramps. You will continue to have bleeding that may last up to a few days. The bleeding is similar to a heavy menstrual period. Your health care provider will make a plan with you for follow-up. In person follow-up may not be necessary if you do not have any problems from the procedure. Summary Medication abortion is a procedure that uses medicines to end a pregnancy. This is a safe procedure. However, there are potential risks, including parts of the placenta and other afterbirth remaining in your uterus, and failure to end the pregnancy. You will be given medicine in tablet form to take by mouth. After you take the medicine, you will be able to go home. Bleeding will begin that may last for a few hours or up to a few days. Your health care provider will make a plan with you for follow-up. This information is not intended to replace advice given to you by your health care provider. Make sure you discuss any questions you have with your health care provider. Document Revised: 06/28/2020 Document Reviewed: 06/28/2020 Elsevier Patient Education  2023 Elsevier Inc.   ?? ??? ???? ??? ?? ??? ??? ???. ?? ??? ???? ?????? ???? ??? ?? ???? ?? ????:  ??? ???? ?? ??? ???? ? ???? ?? ?? ???? ???? ??? ?? ??? ??? ???? ?????. ??? ??? ????? ?? ????? ??? ???? ??? ???? ?? ?? ??? ?? ?? ?? ???? ??? ??? ?? ??????? ?? ?? ?? ??? ????. ??????? ?????  ?????.  ??? ????? ???? ?? ???????. ??? ??? ????? ?????? ????? ????? ????? ??????? ??? ?? ???? ????? ??????? ? ???? ??? ??? ????? ??? ????? ???.  ????? ????? ???? ?? ????. ??? ????? ???? ??? ???? ????? ??? ????: ?? ?????. ?? ???? ? ???????. ?? ?????. o ????? ??????. ?? ??. ?? ???.  ?? ??? ??? ?? ?????? ?? ?????  ???? ?? ?? ???? ??? ?? ???? ???? ???? ?? ????. ??? ?? ???? ????? ??? ???? ?????? ??? ?? ???? ?????.  ???? ??????? ??? ?? ???? ??? ???? ??? ???? ?? ?? ??? ??? ??? ?? ???? ???? ?? ?? ???? ?????? ????? ?? ????? ???.  ?? ??? 1-3 ??? ?? ?? ???? ??? ????? ?? ???  ????? ???? ????? ?? ?? ???? ????? ???? ???? ? ?? ?? ???? ???? ?? ?? ?? ??? ?????? ?? ???? ??? ?? (???? ?????). ?????????? ??? ????? ????? ?????? ??? ??????? ??? ?? ?? ??? ????? ????.  ??? ??? Rh ???? ?????? ?? ??? ?????????????? Rh ???? ?? ??? ?? ?? ?????? Rh ??????? ????. ??? ??? ???? ??? ?? ???? ????? ??????? ?????? ??? ??????? ? ????????? ?? ?????? ???. ??? ?? ??? ?? ?????? ?? ?????  ???? ??? ???? ??? ??? ?????? ????? ????? ????. ???? ???? ??? ????? ?????? ?????? ?????.  ??? ?????? ?? ??????? ??? ?? ???? ??? ?? ??? ??? ??? ?? ???. ??????? ???? ?? ?? ???? ?????? ????? ???.  ????? ????? ?????? ??? ??????? ??? ?? ?????? ?? ??? ???? ?????? ??. ?????? ??? ???? ??? ???? ???? ??? ??? ??? ????? ?? ??? ?????.

## 2022-04-05 DIAGNOSIS — Z3490 Encounter for supervision of normal pregnancy, unspecified, unspecified trimester: Secondary | ICD-10-CM | POA: Insufficient documentation

## 2022-04-05 NOTE — Assessment & Plan Note (Signed)
Patient presented to check pregnancy which was positive.  Patient inquiring about terminating pregnancy due to recently getting married and husband not desiring at this time. Provided support and a listening ear. Gave her information on the clinics that perform abortions. Recommended follow up with [place] either after her abortion if she chooses to go that route, or for her initial OB if she decides to keep baby.

## 2022-04-14 ENCOUNTER — Other Ambulatory Visit: Payer: Self-pay

## 2022-04-14 ENCOUNTER — Telehealth: Payer: Self-pay | Admitting: Family Medicine

## 2022-04-14 ENCOUNTER — Inpatient Hospital Stay (HOSPITAL_COMMUNITY): Payer: BC Managed Care – PPO

## 2022-04-14 ENCOUNTER — Inpatient Hospital Stay (HOSPITAL_COMMUNITY)
Admission: AD | Admit: 2022-04-14 | Discharge: 2022-04-14 | Disposition: A | Discharge status: Home / Self Care | Payer: BC Managed Care – PPO | Attending: Obstetrics and Gynecology | Admitting: Obstetrics and Gynecology

## 2022-04-14 ENCOUNTER — Encounter (HOSPITAL_COMMUNITY): Payer: Self-pay | Admitting: Obstetrics and Gynecology

## 2022-04-14 DIAGNOSIS — Z3A01 Less than 8 weeks gestation of pregnancy: Secondary | ICD-10-CM

## 2022-04-14 DIAGNOSIS — Z3491 Encounter for supervision of normal pregnancy, unspecified, first trimester: Secondary | ICD-10-CM | POA: Diagnosis not present

## 2022-04-14 DIAGNOSIS — O209 Hemorrhage in early pregnancy, unspecified: Secondary | ICD-10-CM | POA: Diagnosis not present

## 2022-04-14 DIAGNOSIS — O009 Unspecified ectopic pregnancy without intrauterine pregnancy: Secondary | ICD-10-CM | POA: Diagnosis present

## 2022-04-14 LAB — CBC
HCT: 36.2 % (ref 36.0–46.0)
Hemoglobin: 12.3 g/dL (ref 12.0–15.0)
MCH: 29 pg (ref 26.0–34.0)
MCHC: 34 g/dL (ref 30.0–36.0)
MCV: 85.4 fL (ref 80.0–100.0)
Platelets: 278 10*3/uL (ref 150–400)
RBC: 4.24 MIL/uL (ref 3.87–5.11)
RDW: 13.1 % (ref 11.5–15.5)
WBC: 9.2 10*3/uL (ref 4.0–10.5)
nRBC: 0 % (ref 0.0–0.2)

## 2022-04-14 LAB — ABO/RH
ABO/RH(D): AB NEG
Antibody Screen: NEGATIVE

## 2022-04-14 MED ORDER — RHO D IMMUNE GLOBULIN 1500 UNIT/2ML IJ SOSY
300.0000 ug | PREFILLED_SYRINGE | Freq: Once | INTRAMUSCULAR | Status: DC
Start: 2022-04-14 — End: 2022-04-14
  Filled 2022-04-14: qty 2

## 2022-04-14 NOTE — Telephone Encounter (Addendum)
Received phone call from sponsor, Edyth Gunnels. Patient was seen at [facility] and found to have an ectopic pregnancy.  The patient is currently [redacted] weeks along in her pregnancy.  I then called the patient at work.  She reports she had light spotting yesterday but this is picked up today and is heavier today.  She denies any pain.  Given new bleeding in the setting of ectopic pregnancy I recommended evaluation at the maternity assessment unit.  Attempted to [facility] to obtain ultrasound and my staff was unable to do so.  Also followed up with patient's sponsor. Patient and sponsor will proceed to MAU.

## 2022-04-14 NOTE — MAU Note (Signed)
Sent from [facility] for possible ectopic pregnancy. Reports mild cramping  and some spotting.

## 2022-04-14 NOTE — MAU Provider Note (Signed)
History     [id number]  Arrival date and time: 04/14/22 1617    Chief Complaint  Patient presents with   Ectopic Pregnancy     HPI Dawn Kennedy is a 31 y.o. at [redacted]w[redacted]d who presents for vaginal bleeding. [facility] went to [facility] & was told that she had an ectopic pregnancy. She spoke with her PCP who instructed her to come to MAU due to new onset vaginal bleeding.  Reports bleeding since this morning. Not saturating pads or passing blood clots.Reports some abdominal cramping throughout her lower abdomen.    OB History     Gravida  1   Para      Term      Preterm      AB      Living         SAB      IAB      Ectopic      Multiple      Live Births              Past Medical History:  Diagnosis Date   Constipated     Past Surgical History:  Procedure Laterality Date   NO PAST SURGERIES      History reviewed. No pertinent family history.  No Known Allergies  No current facility-administered medications on file prior to encounter.   No current outpatient medications on file prior to encounter.     ROS Pertinent positives and negative per HPI, all others reviewed and negative  Physical Exam   Resp 20   LMP 03/05/2022 (Approximate)   Patient Vitals for the past 24 hrs:  Resp  04/14/22 1940 20    Physical Exam Vitals and nursing note reviewed.  Constitutional:      General: She is not in acute distress.    Appearance: Normal appearance.  HENT:     Head: Normocephalic and atraumatic.  Eyes:     General: No scleral icterus.    Conjunctiva/sclera: Conjunctivae normal.  Pulmonary:     Effort: Pulmonary effort is normal. No respiratory distress.  Abdominal:     General: Abdomen is flat.     Palpations: Abdomen is soft.     Tenderness: There is no abdominal tenderness. There is no guarding or rebound.  Skin:    General: Skin is warm and dry.  Neurological:     Mental Status: She is alert.  Psychiatric:         Mood and Affect: Mood normal.        Behavior: Behavior normal.      Labs Results for orders placed or performed during the hospital encounter of 04/14/22 (from the past 24 hour(s))  CBC     Status: None   Collection Time: 04/14/22  5:54 PM  Result Value Ref Range   WBC 9.2 4.0 - 10.5 K/uL   RBC 4.24 3.87 - 5.11 MIL/uL   Hemoglobin 12.3 12.0 - 15.0 g/dL   HCT 85.4 62.7 - 03.5 %   MCV 85.4 80.0 - 100.0 fL   MCH 29.0 26.0 - 34.0 pg   MCHC 34.0 30.0 - 36.0 g/dL   RDW 00.9 38.1 - 82.9 %   Platelets 278 150 - 400 K/uL   nRBC 0.0 0.0 - 0.2 %  ABO/Rh     Status: None   Collection Time: 04/14/22  5:54 PM  Result Value Ref Range   ABO/RH(D) AB NEG    Antibody Screen  NEG Performed at [facility], [address], [place], [place] [address]   Rh IG workup (includes ABO/Rh)     Status: None (Preliminary result)   Collection Time: 04/14/22  7:08 PM  Result Value Ref Range   Gestational Age(Wks)      5 Performed at [facility], [address] [place], [place] [address]    Unit Number [id number]    Blood Component Type RHIG    Unit division 00    Status of Unit ALLOCATED    Transfusion Status OK TO TRANSFUSE     Imaging US OB LESS THAN 14 WEEKS WITH OB TRANSVAGINAL  Result Date: 04/14/2022 CLINICAL DATA:  Rule out ectopic. Quantitative beta HCG is pending. Estimated gestational age by LMP is 5 weeks 5 days. EXAM: OBSTETRIC <14 WK [place] AND TRANSVAGINAL OB US TECHNIQUE: Both transabdominal and transvaginal ultrasound examinations were performed for complete evaluation of the gestation as well as the maternal uterus, adnexal regions, and pelvic cul-de-sac. Transvaginal technique was performed to assess early pregnancy. COMPARISON:  None Available. FINDINGS: Intrauterine gestational sac: A single intrauterine gestational sac is present. Yolk sac:  Not Visualized. Embryo:  Not Visualized. Cardiac Activity: Not Visualized. MSD: 4 mm   5 w   1 d Subchorionic hemorrhage:   None visualized. Maternal uterus/adnexae: Uterus is anteverted. No myometrial mass lesions identified. Both ovaries are visualized and are normal in appearance. No abnormal adnexal masses. No pelvic fluid collections. IMPRESSION: Probable early intrauterine gestational sac, but no yolk sac, fetal pole, or cardiac activity yet visualized. Recommend follow-up quantitative B-HCG levels and follow-up US in 14 days to assess viability. This recommendation follows SRU consensus guidelines: Diagnostic Criteria for Nonviable Pregnancy Early in the First Trimester. Malva Limes Med 2013; 294:7654-65. Electronically Signed   By: Burman Nieves M.D.   On: 04/14/2022 18:56    MAU Course  Procedures Lab Orders         CBC         hCG, quantitative, pregnancy     Meds ordered this encounter  Medications   DISCONTD: rho (d) immune globulin (RHIG/RHOPHYLAC) injection 300 mcg   Imaging Orders         US OB LESS THAN 14 WEEKS WITH OB TRANSVAGINAL      MDM +UPT UA, CBC, ABO/Rh, quant hCG, and [place] today to rule out ectopic pregnancy which can be life threatening.   RH negative. Patient & her spouse are adamant that he is RH negative as well. Rhogam withheld.   Ultrasound shows IUGS & no evidence of ectopic pregnancy. Discussed results with patient.   Video Dari interpreter used for this encounter Assessment and Plan   1. Vaginal bleeding in pregnancy, first trimester   2. Normal IUP (intrauterine pregnancy) on prenatal ultrasound, first trimester   3. [redacted] weeks gestation of pregnancy    -Reviewed bleeding precautions & reasons to return to MAU  Judeth Horn, NP 04/14/22 7:59 PM

## 2022-04-14 NOTE — Discharge Instructions (Signed)
On ultrasound today, we have found that you have a gestational sac in your uterus. You do not have an ectopic pregnancy.    Return to care  If you have heavier bleeding that soaks through more than 2 pads per hour for an hour or more If you bleed so much that you feel like you might pass out or you do pass out If you have significant abdominal pain that is not improved with Tylenol

## 2022-04-16 LAB — RH IG WORKUP (INCLUDES ABO/RH)
Gestational Age(Wks): 5
Unit division: 0

## 2022-04-25 ENCOUNTER — Encounter: Payer: Self-pay | Admitting: Family Medicine

## 2022-04-25 ENCOUNTER — Ambulatory Visit (INDEPENDENT_AMBULATORY_CARE_PROVIDER_SITE_OTHER): Payer: BC Managed Care – PPO | Admitting: Family Medicine

## 2022-04-25 VITALS — BP 98/56 | HR 69 | Ht 63.0 in | Wt 130.8 lb

## 2022-04-25 DIAGNOSIS — M2480 Other specific joint derangements of unspecified joint, not elsewhere classified: Secondary | ICD-10-CM

## 2022-04-25 DIAGNOSIS — Z332 Encounter for elective termination of pregnancy: Secondary | ICD-10-CM | POA: Diagnosis not present

## 2022-04-25 DIAGNOSIS — Z3009 Encounter for other general counseling and advice on contraception: Secondary | ICD-10-CM

## 2022-04-25 MED ORDER — PRENATAL VITAMIN 27-0.8 MG PO TABS: 1.0000 | ORAL_TABLET | Freq: Every day | ORAL | 3 refills | Status: DC

## 2022-04-25 MED ORDER — PRENATAL VITAMIN 27-0.8 MG PO TABS
1.0000 | ORAL_TABLET | Freq: Every day | ORAL | 3 refills | Status: DC
Start: 2022-04-25 — End: 2022-09-09

## 2022-04-25 MED ORDER — NORGESTIMATE-ETH ESTRADIOL 0.25-35 MG-MCG PO TABS
1.0000 | ORAL_TABLET | Freq: Every day | ORAL | 11 refills | Status: DC
Start: 2022-04-25 — End: 2022-11-11

## 2022-04-25 NOTE — Patient Instructions (Addendum)
You can start a good multivitamin or use a brand such as [employer] that is tested and typically has the same things in every tablet.   I have also sent in a prenatal vitamin if you want to take this, it is actually really good for any woman who is able to get pregnant, but also just a good vitamin in general.  If you start having any pain in your joints, make sure to follow-up with [place] but at this time I have no concerns and the popping is likely from overuse.

## 2022-04-25 NOTE — Progress Notes (Signed)
    SUBJECTIVE:   CHIEF COMPLAINT / HPI:   In-person Dari interpreter present during entire visit.  Abortion - Started methotrexate on July 25th - Was bleeding for 1.5 weeks ago, stopped 3 days ago - Is wanting to restart birth control  Joint noises - Patient notes crackling sounds in her arms, wrists, and knees - No associated pain or swelling - Wants to make sure there is no concern - Her job does require repetitive hand motions.   Easy bruising on back of leg - bruises are very minor - Doesn't have any known bleeding disorders  PERTINENT  PMH / PSH: Reviewed  OBJECTIVE:   BP (!) 98/56   Pulse 69   Ht 5\' 3"  (1.6 m)   Wt 130 lb 12.8 oz (59.3 kg)   LMP 03/05/2022 (Approximate)   SpO2 99%   BMI 23.17 kg/m   General: NAD, well-appearing, well-nourished Respiratory: No respiratory distress, breathing comfortably, able to speak in full sentences Skin: warm and dry, no rashes noted on exposed skin. Small areas of slight pigmentation with prominent blood vessels on bilateral thighs (1-2 per leg) Psych: Appropriate affect and mood MSK: full ROM of the bilateral wrists, shoulders, and knees without pain or issues. Some crepitus auscultated with bending of the bilateral knees   ASSESSMENT/PLAN:   Counseling for birth control  recent elective abortion Patient completed course of methotrexate without complication, vaginal bleeding has recently stopped without recurrence. - Ortho-Cyclen prescribed - Prenatal vitamin encouraged and prescribed  Easy bruising Very slight bruises noted on legs, given normal platelets and no anemia I have low concern for bleeding disorder of any significance at this time.  Do not believe it is worth testing unless further episodes of complications.  Joint crepitus Benign without pain or swelling in all extremities.  Sound appears to bother patient but overall there is no concerning signs on examination. - Follow-up if swelling or pain  begins   03/07/2022, DO [facility]

## 2022-07-14 ENCOUNTER — Ambulatory Visit: Payer: No Typology Code available for payment source | Admitting: Student

## 2022-09-09 ENCOUNTER — Encounter: Payer: Self-pay | Admitting: Student

## 2022-09-09 ENCOUNTER — Ambulatory Visit (INDEPENDENT_AMBULATORY_CARE_PROVIDER_SITE_OTHER): Payer: BC Managed Care – PPO | Admitting: Student

## 2022-09-09 VITALS — BP 100/60 | HR 99 | Ht 63.0 in | Wt 139.6 lb

## 2022-09-09 DIAGNOSIS — Z3490 Encounter for supervision of normal pregnancy, unspecified, unspecified trimester: Secondary | ICD-10-CM | POA: Insufficient documentation

## 2022-09-09 DIAGNOSIS — Z3200 Encounter for pregnancy test, result unknown: Secondary | ICD-10-CM | POA: Diagnosis not present

## 2022-09-09 DIAGNOSIS — Z3A01 Less than 8 weeks gestation of pregnancy: Secondary | ICD-10-CM | POA: Diagnosis not present

## 2022-09-09 LAB — POCT URINE PREGNANCY: Preg Test, Ur: POSITIVE — AB

## 2022-09-09 MED ORDER — PRENATAL VITAMIN 27-0.8 MG PO TABS: 1.0000 | ORAL_TABLET | Freq: Every day | ORAL | 3 refills | Status: DC

## 2022-09-09 NOTE — Assessment & Plan Note (Addendum)
Pregnancy confirmed with urine pregnancy test in clinic today.  Well-appearing, hemodynamically stable with normal vital signs. Wishes to proceed with pregnancy. - Obtained initial prenatal labs today, will review them at her initial prenatal visit that she is to schedule in the next couple weeks. - Encouraged her to continue taking prenatal vitamin daily and discussed importance in terms of preventing neural tube defects.  I refilled this and sent it to her pharmacy. - Reassured her that prior ectopic pregnancy was in no way any fault of hers-I explained to her that the most important thing she can do right now is nourish her body with food and water, take her prenatal vitamin, and follow-up with [place] on scheduled OB visits. - She seems very happy today, but is anxious about something going wrong with the pregnancy, which is very reasonable given prior loss of pregnancy. -Discussed return precautions and MAU precautions

## 2022-09-09 NOTE — Progress Notes (Addendum)
    SUBJECTIVE:   CHIEF COMPLAINT / HPI:   Dawn Kennedy is a very pleasant 31 year old female here with in person Dari interpreter and her husband after having had a positive home pregnancy test, which she is very excited about.  She has a lot of questions today about what she should do to make sure this pregnancy is a success in terms of work, diet, any sort of restrictions. She has been taking a prenatal vitamin and will like a refill on that today. 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  She is able to do this easily.  Has no physical complaints today.  Denies any vaginal bleeding.  She has not been having any nausea or vomiting.  She said she will let us know if she does.  Of note, she did have an ectopic pregnancy in July 2023, that was terminated with methotrexate.  PERTINENT  PMH / PSH: Reviewed  OBJECTIVE:   BP 100/60   Pulse 99   Ht 5\' 3"  (1.6 m)   Wt 139 lb 9.6 oz (63.3 kg)   LMP 08/14/2022   SpO2 98%   BMI 24.73 kg/m   General: Well-appearing female, no distress CV: Regular rate and rhythm Respiratory: Normal work of breathing on room air Extremities: Warm dry well-perfused   ASSESSMENT/PLAN:   Pregnancy Pregnancy confirmed with urine pregnancy test in clinic today.  Well-appearing, hemodynamically stable with normal vital signs. Wishes to proceed with pregnancy. - Obtained initial prenatal labs today, will review them at her initial prenatal visit that she is to schedule in the next couple weeks. - Encouraged her to continue taking prenatal vitamin daily and discussed importance in terms of preventing neural tube defects.  I refilled this and sent it to her pharmacy. - Reassured her that prior ectopic pregnancy was in no way any fault of hers-I explained to her that the most important thing she can do right now is nourish her body with food and water, take her prenatal vitamin, and follow-up with 08/16/2022 on scheduled OB visits. - She seems very  happy today, but is anxious about something going wrong with the pregnancy, which is very reasonable given prior loss of pregnancy. -Discussed return precautions and MAU precautions     [place], DO [facility]

## 2022-09-09 NOTE — Patient Instructions (Addendum)
Great to see you today. Congratulations!! I am so happy for you!  To apply for Medicaid, go to the [facility] website and use the ePASS system.   Pregnancy Related Return Precautions The follow are signs/symptoms that are abnormal in pregnancy and may require further evaluation by a physician: Go to the MAU at [facility] if: You have cramping/contractions that do not go away with drinking water, especially if they are lasting 30 seconds to 1.5 minutes, coming and going every 5-10 minutes for an hour or more, or are getting stronger and you cannot walk or talk while having a contraction/cramp. Your water breaks.  Sometimes it is a big gush of fluid, sometimes it is just a trickle that keeps getting your underwear wet or running down your legs You have vaginal bleeding.    You do not feel your baby moving like normal.  If you do not, get something to eat and drink (something cold or something with sugar like peanut butter or juice) and lay down and focus on feeling your baby move. If your baby is still not moving like normal, you should go to MAU. You should feel your baby move 6 times in one hour, or 10 times in two hours. You have a persistent headache that does not go away with 1 g of Tylenol, vision changes, chest pain, difficulty breathing, severe pain in your right upper abdomen, worsening leg swelling- these can all be signs of high blood pressure in pregnancy and need to be evaluated by a provider immediately  These are all concerning in pregnancy and if you have any of these I recommend you call your PCP and present to the Maternity Admissions Unit (map below) for further evaluation.  For any pregnancy-related emergencies, please go to the Maternity Admissions Unit in the [facility] at [facility]. You will use hospital Entrance C.          Dr. Melissa Noon [phone]

## 2022-09-12 LAB — CBC/D/PLT+RPR+RH+ABO+RUBIGG...
Antibody Screen: NEGATIVE
Basophils Absolute: 0 10*3/uL (ref 0.0–0.2)
Basos: 0 %
Bilirubin, UA: NEGATIVE
EOS (ABSOLUTE): 0.2 10*3/uL (ref 0.0–0.4)
Eos: 2 %
Glucose, UA: NEGATIVE
HCV Ab: NONREACTIVE
HIV Screen 4th Generation wRfx: NONREACTIVE
Hematocrit: 40.5 % (ref 34.0–46.6)
Hemoglobin: 13.1 g/dL (ref 11.1–15.9)
Hepatitis B Surface Ag: NEGATIVE
Immature Grans (Abs): 0.1 10*3/uL (ref 0.0–0.1)
Immature Granulocytes: 1 %
Ketones, UA: NEGATIVE
Leukocytes,UA: NEGATIVE
Lymphocytes Absolute: 2.2 10*3/uL (ref 0.7–3.1)
Lymphs: 23 %
MCH: 28.9 pg (ref 26.6–33.0)
MCHC: 32.3 g/dL (ref 31.5–35.7)
MCV: 89 fL (ref 79–97)
Monocytes Absolute: 0.9 10*3/uL (ref 0.1–0.9)
Monocytes: 10 %
Neutrophils Absolute: 6 10*3/uL (ref 1.4–7.0)
Neutrophils: 64 %
Nitrite, UA: NEGATIVE
Platelets: 311 10*3/uL (ref 150–450)
Protein,UA: NEGATIVE
RBC, UA: NEGATIVE
RBC: 4.53 x10E6/uL (ref 3.77–5.28)
RDW: 12.6 % (ref 11.7–15.4)
RPR Ser Ql: NONREACTIVE
Rh Factor: NEGATIVE
Rubella Antibodies, IGG: 2.32 index (ref >0.99)
Specific Gravity, UA: 1.02 (ref 1.005–1.030)
Urobilinogen, Ur: 0.2 mg/dL (ref 0.2–1.0)
WBC: 9.4 10*3/uL (ref 3.4–10.8)
pH, UA: 7 (ref 5.0–7.5)

## 2022-09-12 LAB — MICROSCOPIC EXAMINATION
Casts: NONE SEEN /lpf
RBC, Urine: NONE SEEN /hpf (ref 0–2)

## 2022-09-12 LAB — HGB FRACTIONATION CASCADE
Hgb A2: 2.7 % (ref 1.8–3.2)
Hgb A: 97.3 % (ref 96.4–98.8)
Hgb F: 0 % (ref 0.0–2.0)
Hgb S: 0 %

## 2022-09-12 LAB — HCV INTERPRETATION

## 2022-09-12 LAB — URINE CULTURE, OB REFLEX

## 2022-09-13 ENCOUNTER — Telehealth: Payer: Self-pay | Admitting: Student

## 2022-09-13 DIAGNOSIS — R8271 Bacteriuria: Secondary | ICD-10-CM

## 2022-09-13 MED ORDER — CEPHALEXIN 500 MG PO CAPS: 500.0000 mg | ORAL_CAPSULE | Freq: Four times a day (QID) | ORAL | 0 refills | Status: AC

## 2022-09-13 NOTE — Telephone Encounter (Signed)
Called patient using [facility] interpreting services with Dari language interpreter. Let her know her labs overall within normal limits but that she does have bacteria in her urine. Denies any antibiotic/medication allergies. Will send in keflex 500 q6h for 7 days (susceptible to third gen cephalosporins on susceptibilities) Patient amenable and aware. All questions answered.

## 2022-09-22 NOTE — L&D Delivery Note (Signed)
OB/GYN Faculty Practice Operative Vaginal Delivery Note  Dawn Kennedy is a 32 y.o. G2P0010 s/p VAVD at [redacted]w[redacted]d. She was admitted for IOL 2/2 poorly controlled A2GDM.   ROM: 18h 8m with clear fluid GBS Status:  Negative/-- (07/18 0452) Maximum Maternal Temperature: 100.6  Labor Progress: Initial SVE: 1.5/thick/ballotable. She then progressed to complete.   Delivery Date/Time: 04/27/2023 @0718   Delivery: Called to room and patient was complete and pushing for almost 3 hours.   Indication for operative vaginal delivery: Maternal exhaustion  Patient was examined and found to be fully dilated with fetal station of +2.  Patient's foley catheter was noted to be in place, and there were no known fetal contraindications to operative vaginal delivery. FHR tracing Cat I   Risks of vacuum assistance were discussed in detail, including but not limited to, bleeding, infection, damage to maternal tissues, fetal cephalohematoma, inability to effect vaginal delivery of the head or shoulder dystocia that cannot be resolved by established maneuvers and need for emergency cesarean section.  Patient gave verbal consent.  The soft vacuum cup was positioned over the sagittal suture 3 cm anterior to posterior fontanelle.  Pressure was then increased to 500 mmHg, and the patient was instructed to push.  Pulling was administered along the pelvic curve while patient was pushing; there were 2 contractions and 1 popoffs.  Vacuum was reduced in between contractions.   Head delivered LOA. There was a loose nuchal cord present x1. Shoulder and body delivered in usual fashion. Infant with spontaneous cry, placed on mother's abdomen, dried and stimulated. Cord clamped x 2 after 1-minute delay, and cut by FOB. Cord blood drawn. Placenta delivered spontaneously with gentle cord traction. Fundus firm with massage and Pitocin. Labia, perineum, vagina, and cervix inspected. There was a 3a laceration that required repair.    Baby  Weight: pending Placenta: 3 vessel, intact. Sent to L&D Complications: Chorio, treated with amp and gent Lacerations: 3a perineal laceration EBL: 370 mL Analgesia: Epidural   Infant:  APGAR (1 MIN): 7  APGAR (5 MINS): 9   [name], DO OB Fellow, Faculty [facility], [facility] [employer] 04/27/2023, 8:39 AM

## 2022-11-11 ENCOUNTER — Ambulatory Visit (INDEPENDENT_AMBULATORY_CARE_PROVIDER_SITE_OTHER): Payer: Medicaid Other | Admitting: Family Medicine

## 2022-11-11 ENCOUNTER — Encounter: Payer: Self-pay | Admitting: Family Medicine

## 2022-11-11 VITALS — BP 90/53 | HR 81 | Wt 150.4 lb

## 2022-11-11 DIAGNOSIS — Z3491 Encounter for supervision of normal pregnancy, unspecified, first trimester: Secondary | ICD-10-CM

## 2022-11-11 DIAGNOSIS — Z3490 Encounter for supervision of normal pregnancy, unspecified, unspecified trimester: Secondary | ICD-10-CM

## 2022-11-11 MED ORDER — ASPIRIN 81 MG PO TBEC: 81.0000 mg | DELAYED_RELEASE_TABLET | Freq: Every day | ORAL | 12 refills | Status: DC

## 2022-11-11 MED ORDER — PRENATAL VITAMIN 27-0.8 MG PO TABS: 1.0000 | ORAL_TABLET | Freq: Every day | ORAL | 5 refills | Status: DC

## 2022-11-11 NOTE — Progress Notes (Signed)
Patient Name: Shamanda Niziolek Date of Birth: [birth year] [place] Initial Prenatal Visit  Andrena Holderness is a 32 y.o. year old G1P0 at Unknown who presents for her initial prenatal visit. Pregnancy is planned She reports  back pain occasionally . She is taking a prenatal vitamin.  She denies pelvic pain or vaginal bleeding.   Pregnancy Dating: The patient is dated by LMP.  LMP: [id number] Period is certain:  Yes.  Periods were regular:  Yes.  LMP was a typical period:  Yes.  Using hormonal contraception in 3 months prior to conception: Yes, got pregnant 2 months after stopping OCPs.   Lab Review: Blood type: AB Rh Status: Negative Antibody screen: Negative HIV: Negative RPR: Negative Hemoglobin electrophoresis reviewed: Yes Results of OB urine culture are: Positive for Klebsiella pneumoniae, patient completed course of Keflex.  Rubella: Immune Hep C Ab: Negative Varicella status is Unknown  PMH: Reviewed and as detailed below: HTN: No  Gestational Hypertension/preeclampsia: No  Type 1 or 2 Diabetes: No  Depression:  No  Seizure disorder:  No VTE: No ,  History of STI No,  Abnormal Pap smear:  No, Genital herpes simplex:  No   PSH: Gynecologic Surgery:  no Surgical history reviewed, notable for: none  Obstetric History: Obstetric history tab updated and reviewed.  Summary of prior pregnancies: 1, ectopic pregnancy  Cesarean delivery: No  Gestational Diabetes:  No Hypertension in pregnancy: No History of preterm birth: No History of LGA/SGA infant:  No History of shoulder dystocia: No Indications for referral were reviewed, and the patient has no obstetric indications for referral to [facility] at this time.   Social History: Partner's name: Melene Plan  Tobacco use: No Alcohol use:  No Other substance use:  No  Current Medications:  Prenatal vitamin daily   Reviewed and appropriate in pregnancy.   Genetic and Infection  Screen: Flow Sheet Updated Yes  Prenatal Exam: Gen: Well nourished, well developed.  No distress.  Vitals noted. HEENT: Normocephalic, atraumatic.  Neck supple without cervical lymphadenopathy, thyromegaly or thyroid nodules.  Fair dentition. CV: RRR no murmur, gallops or rubs Lungs: CTA B.  Normal respiratory effort without wheezes or rales. Abd: soft, NTND. +BS.  Uterus not appreciated above pelvis. Ext: No clubbing, cyanosis or edema. Psych: Normal grooming and dress.  Not depressed or anxious appearing.  Normal thought content and process without flight of ideas or looseness of associations   Assessment/Plan:  Kynzleigh Cavan is a 32 y.o. G1P0 at Unknown who presents to initiate prenatal care. She is doing well.  Current pregnancy issues include supervision of normal pregnancy in first trimester.  Routine prenatal care: As dating is reliable, a dating ultrasound has not been ordered. Dating tab updated. Pre-pregnancy weight updated. Expected weight gain this pregnancy is 15-25 lbs. Prenatal labs reviewed, notable for OB culture with Klebsiella pneumonia but completed course of prescribed treatment of keflex. Indications for referral to [facility] were reviewed and the patient does not meet criteria for referral.  Medication list reviewed and updated.  Recommended patient see a dentist for regular care.  Bleeding and pain precautions reviewed. Importance of prenatal vitamins reviewed.  The patient does have an indication for aspirin therapy beginning at 12-16 weeks. Aspirin was  recommended today.  The patient will not be age 28 or over at time of delivery. Referral to genetic counseling was not offered today.  The patient has the following risk factors for preexisting diabetes: elevated BMI. An early 1  hour glucose tolerance test was not ordered but discussed and will be performed at next visit in 2 weeks.  Pregnancy Medical Home and PHQ-9 forms completed, problems noted: Yes  2.  Pregnancy issues include the following which were addressed today:  Will need varicella vaccine after delivery  Aspirin started today for preeclampsia prophylaxis  Continue prenatal vitamin daily, refills provided.  Discussed importance of PAP smear for cervical cancer screening. Discussed importance of 1 hour glucola testing.  Patient politely declines Dari interpretor.   Scheduled follow up in 2 weeks for PAP smear and 1 hour glucola testing then follow up 4 weeks for next prenatal visit.

## 2022-11-11 NOTE — Patient Instructions (Addendum)
It was great seeing you today!  Congratulations on your pregnancy! I have sent refills in for your prenatal vitamin, please continue to take these daily. In order to prevent preeclampsia, please take aspirin 81 mg daily.   Go to the MAU at [place] at [facility] if: You have pain in your lower abdomen or pelvic area Your water breaks.  Sometimes it is a big gush of fluid, sometimes it is just a trickle that keeps getting your underwear wet or running down your legs You have vaginal bleeding.   Next visit, we will do the sugar testing and a PAP smear.   Please follow up at your next scheduled appointment 3/4 at 3:30 pm (please arrive by 3:15 pm and plan to stay for 1 hour), if anything arises between now and then, please don't hesitate to contact our office.   Thank you for allowing [place] to be a part of your medical care!  Thank you, Dr. Larae Grooms  Also a reminder of our clinic's no-show policy. Please make sure to arrive at least 15 minutes prior to your scheduled appointment time. Please try to cancel before 24 hours if you are not able to make it. If you no-show for 2 appointments then you will be receiving a warning letter. If you no-show after 3 visits, then you may be at risk of being dismissed from our clinic. This is to ensure that everyone is able to be seen in a timely manner. Thank you, we appreciate your assistance with this!

## 2022-11-14 ENCOUNTER — Encounter: Payer: No Typology Code available for payment source | Admitting: Family Medicine

## 2022-11-14 ENCOUNTER — Ambulatory Visit (INDEPENDENT_AMBULATORY_CARE_PROVIDER_SITE_OTHER): Payer: Medicaid Other

## 2022-11-14 ENCOUNTER — Ambulatory Visit: Payer: No Typology Code available for payment source | Admitting: *Deleted

## 2022-11-14 VITALS — BP 104/70 | HR 89 | Ht 61.42 in | Wt 151.3 lb

## 2022-11-14 DIAGNOSIS — Z3A13 13 weeks gestation of pregnancy: Secondary | ICD-10-CM | POA: Diagnosis not present

## 2022-11-14 DIAGNOSIS — Z3481 Encounter for supervision of other normal pregnancy, first trimester: Secondary | ICD-10-CM

## 2022-11-14 DIAGNOSIS — Z3480 Encounter for supervision of other normal pregnancy, unspecified trimester: Secondary | ICD-10-CM

## 2022-11-14 NOTE — Progress Notes (Signed)
Pt presented today as OB Intake, however in the course of asking pt intake questions it became apparent that she has been getting care at the [facility] with Dr. Larae Grooms. Pt reports that she scheduled here because she was told she would not be getting a first trimester [place] at [facility]. The pt has experienced a recent SAB and has been very anxious about this pregnancy. She states that she can't sleep well and is always worried about the pregnancy. Pt says that she would like [place] for reassurance. [place] intake note not completed as pt has had intake at [facility]. Pt is scheduled for f/u appt with them on 11/24/22 for New OB labs, Pap, GTT and would like to keep that appointment and continue care there.  We discussed a due date of 05/21/23 based on LMP of 08/14/22 and confirmed by today's [place]. Message sent to Dr. Larae Grooms.

## 2022-11-24 ENCOUNTER — Ambulatory Visit: Payer: Medicaid Other | Admitting: Family Medicine

## 2022-11-24 ENCOUNTER — Other Ambulatory Visit (HOSPITAL_COMMUNITY)
Admission: RE | Admit: 2022-11-24 | Discharge: 2022-11-24 | Disposition: A | Discharge status: Home / Self Care | Payer: Medicaid Other | Source: Ambulatory Visit | Attending: Family Medicine | Admitting: Family Medicine

## 2022-11-24 ENCOUNTER — Encounter: Payer: Self-pay | Admitting: Family Medicine

## 2022-11-24 VITALS — BP 104/69 | HR 76 | Ht 61.0 in | Wt 153.2 lb

## 2022-11-24 DIAGNOSIS — Z124 Encounter for screening for malignant neoplasm of cervix: Secondary | ICD-10-CM | POA: Diagnosis present

## 2022-11-24 DIAGNOSIS — Z3491 Encounter for supervision of normal pregnancy, unspecified, first trimester: Secondary | ICD-10-CM

## 2022-11-24 LAB — POCT 1 HR PRENATAL GLUCOSE: Glucose 1 Hr Prenatal, POC: 162 mg/dL

## 2022-11-24 NOTE — Progress Notes (Signed)
    SUBJECTIVE:   CHIEF COMPLAINT / HPI:   Patient presents for OB follow up, here for 1 hour glucola and PAP smear. She denies vaginal bleeding, leakage or gush of fluid or abdominal/pelvic pain. Denies any concerns at this time. She continues to take her prenatal vitamin and aspirin daily as instructed.   OBJECTIVE:   BP 104/69   Pulse 76   Ht '5\' 1"'$  (1.549 m)   Wt 153 lb 3.2 oz (69.5 kg)   LMP 08/14/2022 (Exact Date)   SpO2 100%   BMI 28.95 kg/m   General: Patient well-appearing, in no acute distress. Resp: normal work of breathing noted GU: normal labia, no rashes or external lesions noted, no vaginal or cervical discharge noted, no adnexal masses or tenderness noted  GU exam performed in the presence of chaperone, Lavell Anchors, CMA.   ASSESSMENT/PLAN:   First trimester pregnancy -PAP smear and 1 hour glucola performed today, extensively discussed the importance of these tests and the need for cervical cancer screening.  -advised to continue prenatal vitamin and aspirin daily  -PHQ-9 score of 0 reviewed. -next OB follow up scheduled for 3/25    In-person Dari interpretation [facility]) utilized throughout the entirety of this encounter.    Donney Dice, [place]

## 2022-11-24 NOTE — Patient Instructions (Addendum)
It was great seeing you today!  Today we did a PAP smear for cervical cancer screening. I will let you know of the results when they return. Please continue to take your prenatal vitamin everyday. Your sugar testing results are pending.   Go to the MAU at [place] at [facility] if: You have pain in your lower abdomen or pelvic area Your water breaks.  Sometimes it is a big gush of fluid, sometimes it is just a trickle that keeps getting your underwear wet or running down your legs You have vaginal bleeding.   Please follow up at your next scheduled appointment on 3/25 at 2:10 pm, if anything arises between now and then, please don't hesitate to contact our office.   Thank you for allowing [place] to be a part of your medical care!  Thank you, Dr. Larae Grooms  Also a reminder of our clinic's no-show policy. Please make sure to arrive at least 15 minutes prior to your scheduled appointment time. Please try to cancel before 24 hours if you are not able to make it. If you no-show for 2 appointments then you will be receiving a warning letter. If you no-show after 3 visits, then you may be at risk of being dismissed from our clinic. This is to ensure that everyone is able to be seen in a timely manner. Thank you, we appreciate your assistance with this!

## 2022-11-24 NOTE — Assessment & Plan Note (Signed)
-  PAP smear and 1 hour glucola performed today, extensively discussed the importance of these tests and the need for cervical cancer screening.  -advised to continue prenatal vitamin and aspirin daily  -PHQ-9 score of 0 reviewed. -next OB follow up scheduled for 3/25

## 2022-11-26 LAB — CYTOLOGY - PAP
Chlamydia: NEGATIVE
Comment: NEGATIVE
Comment: NEGATIVE
Comment: NORMAL
Diagnosis: NEGATIVE
High risk HPV: NEGATIVE
Neisseria Gonorrhea: NEGATIVE

## 2022-11-27 ENCOUNTER — Encounter: Payer: Self-pay | Admitting: Family Medicine

## 2022-11-28 ENCOUNTER — Other Ambulatory Visit: Payer: Medicaid Other

## 2022-11-28 DIAGNOSIS — Z3491 Encounter for supervision of normal pregnancy, unspecified, first trimester: Secondary | ICD-10-CM | POA: Diagnosis present

## 2022-11-28 LAB — POCT CBG (FASTING - GLUCOSE)-MANUAL ENTRY: Glucose Fasting, POC: 106 mg/dL — AB (ref 70–99)

## 2022-11-29 LAB — GESTATIONAL GLUCOSE TOLERANCE
Glucose, Fasting: 89 mg/dL (ref 70–94)
Glucose, GTT - 1 Hour: 176 mg/dL (ref 70–179)
Glucose, GTT - 2 Hour: 142 mg/dL (ref 70–154)
Glucose, GTT - 3 Hour: 90 mg/dL (ref 70–139)

## 2022-12-04 ENCOUNTER — Encounter: Payer: No Typology Code available for payment source | Admitting: Advanced Practice Midwife

## 2022-12-15 ENCOUNTER — Ambulatory Visit (INDEPENDENT_AMBULATORY_CARE_PROVIDER_SITE_OTHER): Payer: Medicaid Other | Admitting: Family Medicine

## 2022-12-15 VITALS — BP 106/73 | HR 73 | Wt 154.1 lb

## 2022-12-15 DIAGNOSIS — Z3491 Encounter for supervision of normal pregnancy, unspecified, first trimester: Secondary | ICD-10-CM

## 2022-12-15 DIAGNOSIS — Z3A17 17 weeks gestation of pregnancy: Secondary | ICD-10-CM

## 2022-12-15 DIAGNOSIS — Z3482 Encounter for supervision of other normal pregnancy, second trimester: Secondary | ICD-10-CM

## 2022-12-15 DIAGNOSIS — Z23 Encounter for immunization: Secondary | ICD-10-CM

## 2022-12-15 DIAGNOSIS — Z0120 Encounter for dental examination and cleaning without abnormal findings: Secondary | ICD-10-CM

## 2022-12-15 DIAGNOSIS — Z3490 Encounter for supervision of normal pregnancy, unspecified, unspecified trimester: Secondary | ICD-10-CM

## 2022-12-15 MED ORDER — ASPIRIN 81 MG PO TBEC
81.0000 mg | DELAYED_RELEASE_TABLET | Freq: Every day | ORAL | 12 refills | Status: DC
Start: 2022-12-15 — End: 2023-01-14

## 2022-12-15 MED ORDER — PRENATAL VITAMIN 27-0.8 MG PO TABS
1.0000 | ORAL_TABLET | Freq: Every day | ORAL | 5 refills | Status: DC
Start: 2022-12-15 — End: 2023-01-14

## 2022-12-15 NOTE — Progress Notes (Signed)
  Patient Name: Dawn Kennedy Date of Birth: 1991/08/10 [place] Prenatal Visit  Dawn Kennedy is a 32 y.o. G2P0010 at [redacted]w[redacted]d here for routine follow up. She is dated by LMP.  She reports no complaints.  She denies vaginal bleeding.  See flow sheet for details.  Vitals:   12/15/22 1428  BP: 106/73  Pulse: 73     A/P: Pregnancy at [redacted]w[redacted]d.  Doing well.    Routine Prenatal Care:  Dating reviewed, dating tab is correct. Fetal heart tones appropriate. Influenza vaccine discussed and patient will consider.  COVID vaccination was discussed and patient will consider.  The patient has the following indication for screening preexisting diabetes: negative 3 hour glucola  Anatomy ultrasound ordered to be scheduled at 18-20 weeks. Pregnancy education including expected weight gain in pregnancy, OTC medication use, continued use of prenatal vitamin, smoking cessation if applicable, and nutrition in pregnancy.   Bleeding and pain precautions reviewed.  2. Pregnancy issues include the following and were addressed as appropriate today:  Continue to take aspirin and prenatal vitamin daily, refills provided.  Problem list  and pregnancy box updated: Yes.   -PHQ-9 score of 0 reviewed.   Follow up 4 weeks. Plan to schedule in faculty  OB clinic at next visit.   In-person Dari interpretation [employer]) utilized throughout the entirety of this encounter.

## 2022-12-15 NOTE — Patient Instructions (Signed)
It was great seeing you today!  Glad that you are doing well! Please continue to take aspirin and prenatal vitamin daily, I have sent you refills on both of these. You will also have an anatomy ultrasound scheduled.   Go to the MAU at [place] at [facility] if: You have cramping/contractions that do not go away with drinking water Your water breaks.  Sometimes it is a big gush of fluid, sometimes it is just a trickle that keeps getting your underwear wet or running down your legs You have vaginal bleeding.    You do not feel your baby moving like normal.  If you do not, get something to eat and drink and lay down and focus on feeling your baby move. If your baby is still not moving like normal, you should go to MAU.   Please follow up at your next scheduled appointment in 4 weeks, if anything arises between now and then, please don't hesitate to contact our office.   Thank you for allowing Dawn Kennedy to be a part of your medical care!  Thank you, Dr. Larae Grooms  Also a reminder of our clinic's no-show policy. Please make sure to arrive at least 15 minutes prior to your scheduled appointment time. Please try to cancel before 24 hours if you are not able to make it. If you no-show for 2 appointments then you will be receiving a warning letter. If you no-show after 3 visits, then you may be at risk of being dismissed from our clinic. This is to ensure that everyone is able to be seen in a timely manner. Thank you, we appreciate your assistance with this!

## 2022-12-15 NOTE — Assessment & Plan Note (Signed)
  Nursing Staff Provider  Office Location  [facility] Dating  LMP  [facility] Model [ ]  Traditional [ ]  Centering [ ]  Mom-Baby Dyad    Language  Dari/English Anatomy US    Flu Vaccine   Genetic/Carrier Screen  NIPS:    AFP:    Horizon:  TDaP Vaccine    Hgb A1C or  GTT Early  Third trimester   COVID Vaccine    LAB RESULTS   Rhogam   Blood Type AB/Negative/-- (12/19 1350)   Baby Feeding Plan  Antibody Negative (12/19 1350)  Contraception  Rubella 2.32 (12/19 1350)  Circumcision  RPR Non Reactive (12/19 1350)   Pediatrician   HBsAg Negative (12/19 1350)   Support Person  HCVAb   Prenatal Classes  HIV Non Reactive (12/19 1350)     BTL Consent  GBS   (For PCN allergy, check sensitivities)   VBAC Consent  Pap Negative 11/24/2022       DME Rx [ ]  BP cuff [ ]  Weight Scale Waterbirth  [ ]  Class [ ]  Consent [ ]  CNM visit  PHQ9 & GAD7 [  ] new OB [  ] 28 weeks  [  ] 36 weeks Induction  [ ]  Orders Entered [ ] Foley Y/N

## 2023-01-14 ENCOUNTER — Encounter: Payer: Self-pay | Admitting: *Deleted

## 2023-01-14 ENCOUNTER — Ambulatory Visit (INDEPENDENT_AMBULATORY_CARE_PROVIDER_SITE_OTHER): Payer: Medicaid Other | Admitting: Family Medicine

## 2023-01-14 VITALS — BP 105/69 | HR 96 | Wt 162.2 lb

## 2023-01-14 DIAGNOSIS — Z3490 Encounter for supervision of normal pregnancy, unspecified, unspecified trimester: Secondary | ICD-10-CM

## 2023-01-14 MED ORDER — PRENATAL VITAMIN 27-0.8 MG PO TABS
1.0000 | ORAL_TABLET | Freq: Every day | ORAL | 5 refills | Status: DC
Start: 2023-01-14 — End: 2023-02-12

## 2023-01-14 MED ORDER — ASPIRIN 81 MG PO TBEC: 81.0000 mg | DELAYED_RELEASE_TABLET | Freq: Every day | ORAL | 12 refills | Status: DC

## 2023-01-14 NOTE — Patient Instructions (Addendum)
It was great seeing you today!  I am glad that you are doing well! Please continue to take your prenatal vitamin and aspirin daily, I have sent refills on both of these.   Please make sure to go to your ultrasound appointment to get your anatomy ultrasound, this is on 4/26 at 12:45 pm.   Go to the MAU at [facility] at [facility] if: You have cramping/contractions that do not go away with drinking water Your water breaks.  Sometimes it is a big gush of fluid, sometimes it is just a trickle that keeps getting your underwear wet or running down your legs You have vaginal bleeding.    You do not feel your baby moving like normal.  If you do not, get something to eat and drink and lay down and focus on feeling your baby move. If your baby is still not moving like normal, you should go to MAU.   Please follow up at your next scheduled appointment on 5/23 at 11:30 am, if anything arises between now and then, please don't hesitate to contact our office.   Thank you for allowing [place] to be a part of your medical care!  Thank you, Dr. Robyne Peers

## 2023-01-14 NOTE — Progress Notes (Signed)
  [facility] Prenatal Visit  Dawn Kennedy is a 32 y.o. G2P0010 at [redacted]w[redacted]d here for routine follow up. She is dated by LMP.  She reports  lower extremity swelling .  She reports good fetal movement. No bleeding, loss of fluid, contractions. See flow sheet for details. Vitals:   01/14/23 1034  BP: 105/69  Pulse: 96     A/P: Pregnancy at [redacted]w[redacted]d.  Doing well.   Dating reviewed, dating tab is correct. Fetal heart tones appropriate Fundal height appropriate Anatomy ultrasound ordered at prior visit and scheduled for 4/26.  Influenza vaccine was previously given. COVID vaccination was discussed and patient up to date.  Pregnancy education provided on the following topics: fetal growth and movement, ultrasound assessment, and upcoming laboratory assessment.   Scheduled for Faculty Ob Clinic during second trimester on 5/23. Preterm labor precautions given.   2. Pregnancy issues include the following and were addressed as appropriate today:   PHQ-9 score of 1 with negative question 9 reviewed.   Problem list and pregnancy box updated: Yes.     Follow up 4 weeks.  In-person Dari interpretation ([name]) utilized throughout the entirety of this encounter.

## 2023-01-16 ENCOUNTER — Other Ambulatory Visit: Payer: Self-pay | Admitting: Family Medicine

## 2023-01-16 ENCOUNTER — Ambulatory Visit: Payer: Medicaid Other | Attending: Family Medicine

## 2023-01-16 DIAGNOSIS — A159 Respiratory tuberculosis unspecified: Secondary | ICD-10-CM

## 2023-01-16 DIAGNOSIS — Z3A23 23 weeks gestation of pregnancy: Secondary | ICD-10-CM | POA: Diagnosis not present

## 2023-01-16 DIAGNOSIS — Z3490 Encounter for supervision of normal pregnancy, unspecified, unspecified trimester: Secondary | ICD-10-CM

## 2023-01-16 DIAGNOSIS — O2692 Pregnancy related conditions, unspecified, second trimester: Secondary | ICD-10-CM | POA: Insufficient documentation

## 2023-01-16 DIAGNOSIS — Z23 Encounter for immunization: Secondary | ICD-10-CM

## 2023-01-16 DIAGNOSIS — O2690 Pregnancy related conditions, unspecified, unspecified trimester: Secondary | ICD-10-CM

## 2023-01-16 DIAGNOSIS — Z3689 Encounter for other specified antenatal screening: Secondary | ICD-10-CM | POA: Insufficient documentation

## 2023-01-16 DIAGNOSIS — Z362 Encounter for other antenatal screening follow-up: Secondary | ICD-10-CM | POA: Insufficient documentation

## 2023-01-16 DIAGNOSIS — Z363 Encounter for antenatal screening for malformations: Secondary | ICD-10-CM | POA: Diagnosis present

## 2023-01-16 DIAGNOSIS — O98012 Tuberculosis complicating pregnancy, second trimester: Secondary | ICD-10-CM | POA: Diagnosis not present

## 2023-01-16 DIAGNOSIS — Z3491 Encounter for supervision of normal pregnancy, unspecified, first trimester: Secondary | ICD-10-CM

## 2023-01-16 DIAGNOSIS — Z0120 Encounter for dental examination and cleaning without abnormal findings: Secondary | ICD-10-CM

## 2023-01-19 ENCOUNTER — Telehealth: Payer: Self-pay | Admitting: Family Medicine

## 2023-01-19 NOTE — Telephone Encounter (Signed)
Heard back from [place] with [facility] - patient completed 6 mo isoniazide + B6 in summer 2022. Updated problem list and PCP.  Burley Saver MD

## 2023-01-19 NOTE — Telephone Encounter (Signed)
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  Awaiting call back.  Burley Saver MD

## 2023-02-09 DIAGNOSIS — O26893 Other specified pregnancy related conditions, third trimester: Secondary | ICD-10-CM | POA: Insufficient documentation

## 2023-02-09 DIAGNOSIS — Z6791 Unspecified blood type, Rh negative: Secondary | ICD-10-CM | POA: Insufficient documentation

## 2023-02-11 ENCOUNTER — Telehealth: Payer: Self-pay | Admitting: Family Medicine

## 2023-02-11 NOTE — Telephone Encounter (Signed)
Called patient with Dari interpretor.  Confirmed I was speaking with the patient. Discussed coming early to her appt tomorrow at 830 am, fasting after midnight, to repeat a 3 hr glucose tolerance test. She was in agreement and all questions answered.  Burley Saver MD

## 2023-02-12 ENCOUNTER — Other Ambulatory Visit: Payer: Self-pay

## 2023-02-12 ENCOUNTER — Ambulatory Visit (INDEPENDENT_AMBULATORY_CARE_PROVIDER_SITE_OTHER): Payer: Medicaid Other | Admitting: Family Medicine

## 2023-02-12 VITALS — BP 99/67 | HR 79 | Wt 168.0 lb

## 2023-02-12 DIAGNOSIS — Z3490 Encounter for supervision of normal pregnancy, unspecified, unspecified trimester: Secondary | ICD-10-CM

## 2023-02-12 DIAGNOSIS — R7309 Other abnormal glucose: Secondary | ICD-10-CM

## 2023-02-12 DIAGNOSIS — O26893 Other specified pregnancy related conditions, third trimester: Secondary | ICD-10-CM

## 2023-02-12 DIAGNOSIS — Z6791 Unspecified blood type, Rh negative: Secondary | ICD-10-CM

## 2023-02-12 DIAGNOSIS — Z227 Latent tuberculosis: Secondary | ICD-10-CM

## 2023-02-12 DIAGNOSIS — Z3A27 27 weeks gestation of pregnancy: Secondary | ICD-10-CM

## 2023-02-12 DIAGNOSIS — R8271 Bacteriuria: Secondary | ICD-10-CM

## 2023-02-12 DIAGNOSIS — Z23 Encounter for immunization: Secondary | ICD-10-CM

## 2023-02-12 DIAGNOSIS — O99891 Other specified diseases and conditions complicating pregnancy: Secondary | ICD-10-CM

## 2023-02-12 MED ORDER — PRENATAL VITAMIN 27-0.8 MG PO TABS: 1.0000 | ORAL_TABLET | Freq: Every day | ORAL | 5 refills | Status: DC

## 2023-02-12 MED ORDER — ONDANSETRON 4 MG PO TBDP: 4.0000 mg | ORAL_TABLET | Freq: Once | ORAL | Status: DC

## 2023-02-12 MED ORDER — ASPIRIN 81 MG PO TBEC
81.0000 mg | DELAYED_RELEASE_TABLET | Freq: Every day | ORAL | 12 refills | Status: DC
Start: 2023-02-12 — End: 2023-04-29

## 2023-02-12 NOTE — Patient Instructions (Addendum)
It was great to see you! Thank you for allowing me to participate in your care!   I recommend that you always bring your medications to each appointment as this makes it easy to ensure we are on the correct medications and helps [place] not miss when refills are needed.  Our plans for today:  - We did sugar test and Tdap today - Come back in 1 week for your Rhogam shot - I refilled your medications  Prenatal Classes Go to [email or web address] for more information on the pregnancy and child birth classes that [place] has to offer.   Pregnancy Related Return Precautions The follow are signs/symptoms that are abnormal in pregnancy and may require further evaluation by a physician: Go to the MAU at [facility] at [facility] if: You have cramping/contractions that do not go away with drinking water, especially if they are lasting 30 seconds to 1.5 minutes, coming and going every 5-10 minutes for an hour or more, or are getting stronger and you cannot walk or talk while having a contraction/cramp. Your water breaks.  Sometimes it is a big gush of fluid, sometimes it is just a trickle that keeps getting your underwear wet or running down your legs You have vaginal bleeding.    You do not feel your baby moving like normal.  If you do not, get something to eat and drink (something cold or something with sugar like peanut butter or juice) and lay down and focus on feeling your baby move. If your baby is still not moving like normal, you should go to MAU. You should feel your baby move 6 times in one hour, or 10 times in two hours. You have a persistent headache that does not go away with 1 g of Tylenol, vision changes, chest pain, difficulty breathing, severe pain in your right upper abdomen, worsening leg swelling- these can all be signs of high blood pressure in pregnancy and need to be evaluated by a provider immediately  These are all concerning in  pregnancy and if you have any of these I recommend you call your PCP and present to the Maternity Admissions Unit (map below) for further evaluation.  For any pregnancy-related emergencies, please go to the Maternity Admissions Unit in the [facility] at [facility]. You will use hospital Entrance C.    Our clinic number is [phone].   Take care and seek immediate care sooner if you develop any concerns. Please remember to show up 15 minutes before your scheduled appointment time!  Levin Erp, MD [facility]

## 2023-02-12 NOTE — Progress Notes (Signed)
  [facility] Prenatal Visit  Dawn Kennedy is a 32 y.o. G2P0010 at [redacted]w[redacted]d here for routine follow up. She is dated by LMP.  She reports  RLQ intermittent dull pain-baby stays in this area oft. No dysuria, vomiting or fevers . Mild swelling in legs but no headache or vision changes. She reports good fetal movement. Denies vaginal bleeding, loss of fluid, or contractions.  See flow sheet for details.  A/P: Pregnancy at [redacted]w[redacted]d.  Doing well.   Dating reviewed, dating tab is correct Fetal heart tones Appropriate 149 Fundal height within expected range.  27 cm Influenza vaccine not administered as not influenza season.   COVID vaccination was discussed and declined booster today.  Screening for gestational diabetes completed today. Value >135, scheduled for 3 hour test. today.  Pregnancy education completed including: fetal growth, breastfeeding, contraception, and expected weight gain in pregnancy.   The patient does not have a history of Cesarean delivery and no referral to [facility] for [facility] is indicated Tdap given today Preterm labor, bleeding, and pain precautions given.   General: Well appearing, NAD, awake, alert, responsive to questions Head: Normocephalic atraumatic Respiratory: chest rises symmetrically,  no increased work of breathing Abdomen: Gravid abdomen, nontender to palpation Extremities: Moves upper and lower extremities freely  2. Pregnancy issues include the following and were addressed as appropriate today:   Asymptomatic Klebsiella bacteriuria early pregnancy-treated with keflex-repeat UA today  RH negative-rhogam scheduled for next week  Elevated early 1 hr GTT but passed early 3 hour; repeat 3 hour GTT today  Latent TB-previously completed treatment with isoniazid  Problem list and pregnancy box updated: Yes.   Follow up 1 week for Rhogam

## 2023-02-13 ENCOUNTER — Telehealth: Payer: Self-pay | Admitting: Family Medicine

## 2023-02-13 DIAGNOSIS — O24419 Gestational diabetes mellitus in pregnancy, unspecified control: Secondary | ICD-10-CM

## 2023-02-13 LAB — GESTATIONAL GLUCOSE TOLERANCE
Glucose, Fasting: 112 mg/dL — ABNORMAL HIGH (ref 70–94)
Glucose, GTT - 1 Hour: 255 mg/dL — ABNORMAL HIGH (ref 70–179)
Glucose, GTT - 2 Hour: 224 mg/dL — ABNORMAL HIGH (ref 70–154)
Glucose, GTT - 3 Hour: 207 mg/dL — ABNORMAL HIGH (ref 70–139)

## 2023-02-13 LAB — CBC
Hematocrit: 34.8 % (ref 34.0–46.6)
Hemoglobin: 11.7 g/dL (ref 11.1–15.9)
MCH: 29.8 pg (ref 26.6–33.0)
MCHC: 33.6 g/dL (ref 31.5–35.7)
MCV: 89 fL (ref 79–97)
Platelets: 295 10*3/uL (ref 150–450)
RBC: 3.93 x10E6/uL (ref 3.77–5.28)
RDW: 12.6 % (ref 11.7–15.4)
WBC: 12.5 10*3/uL — ABNORMAL HIGH (ref 3.4–10.8)

## 2023-02-13 LAB — RPR: RPR Ser Ql: NONREACTIVE

## 2023-02-13 LAB — HIV ANTIBODY (ROUTINE TESTING W REFLEX): HIV Screen 4th Generation wRfx: NONREACTIVE

## 2023-02-13 MED ORDER — ACCU-CHEK SOFTCLIX LANCETS MISC
12 refills | Status: DC
Start: 2023-02-13 — End: 2023-04-29

## 2023-02-13 MED ORDER — GLUCOSE BLOOD VI STRP
ORAL_STRIP | 12 refills | Status: DC
Start: 2023-02-13 — End: 2023-04-29

## 2023-02-13 MED ORDER — ACCU-CHEK GUIDE ME W/DEVICE KIT
PACK | 0 refills | Status: DC
Start: 2023-02-13 — End: 2023-04-29

## 2023-02-13 NOTE — Telephone Encounter (Signed)
The patient speaks [place] as their primary language.  An interpreter was used for the entire visit.  Called and discussed results. Discussed checking BG 4X a day, goal BG, how to use glucometer. She has been drinking A LOT of juice. Discussed reducing juice, increasing veggies, walking after meals. Referred to Ob-Gyn--discussed. At upcoming visit - Ensure scheduled with Ob-Gyn - Review glucose log - Please schedule for growth scan ASAP Please plan to precept this visit in real time.  Terisa Starr, MD  Family Medicine Teaching Service

## 2023-02-13 NOTE — Telephone Encounter (Signed)
Called patient--she would prefer interpreter to discuss. Attempted to call back with interpreter, unable to reach her. If calls back please let  her know I will call her around [phone]  Terisa Starr, MD  [facility] Medicine Teaching Service

## 2023-02-14 LAB — CULTURE, OB URINE

## 2023-02-14 LAB — URINE CULTURE, OB REFLEX

## 2023-02-17 ENCOUNTER — Telehealth: Payer: Self-pay

## 2023-02-17 ENCOUNTER — Other Ambulatory Visit: Payer: Self-pay | Admitting: Family Medicine

## 2023-02-17 DIAGNOSIS — O24410 Gestational diabetes mellitus in pregnancy, diet controlled: Secondary | ICD-10-CM

## 2023-02-17 NOTE — Telephone Encounter (Signed)
-----   Message from Margaret E Pray, MD sent at 02/17/2023  2:11 PM EDT ----- Regarding: Please call MFM and schedule growth US ASAP Hi team,  Can we please call MFM and schedule f/u growth US ASAP. Next available would be great, thanks! If we can do this before her appt on 5/30 Dr Ganta can let her know date/time of her US.  Thanks!  Dr Pray  

## 2023-02-17 NOTE — Progress Notes (Signed)
[place] growth ordered. Message sent to clinic staff to schedule.  Burley Saver MD

## 2023-02-17 NOTE — Telephone Encounter (Signed)
-----   Message from Billey Co, MD sent at 02/17/2023  2:11 PM EDT ----- Regarding: Please call MFM and schedule growth [place] ASAP Hi team,  Can we please call MFM and schedule f/u growth [place] ASAP. Next available would be great, thanks! If we can do this before her appt on 5/30 Dr Robyne Peers can let her know date/time of her [place].  Thanks!  Dr Miquel Dunn

## 2023-02-17 NOTE — Telephone Encounter (Signed)
Called MFM. Pt scheduled for [place] on 02/19/2023 @ 2:30 pm. Due to this being a couple of days a way, I called the pt to inform her of this appt. Pt would like Dr Miquel Dunn or someone to call her to let her know why this appt is needed. Please call her at [phone] using a Dari interpreter.   Sunday Spillers, CMA

## 2023-02-18 ENCOUNTER — Ambulatory Visit: Payer: Self-pay

## 2023-02-18 ENCOUNTER — Other Ambulatory Visit: Payer: Self-pay | Admitting: Family Medicine

## 2023-02-18 ENCOUNTER — Encounter: Payer: Self-pay | Admitting: *Deleted

## 2023-02-18 DIAGNOSIS — O24419 Gestational diabetes mellitus in pregnancy, unspecified control: Secondary | ICD-10-CM

## 2023-02-18 HISTORY — DX: Gestational diabetes mellitus in pregnancy, unspecified control: O24.419

## 2023-02-18 NOTE — Telephone Encounter (Signed)
Called patient with Dari interpreter. Explained importance of f/u US for gestational diabetes to monitor fetal well being and growth. Reminded patient of appointment with [place] on 02/19/23 at 930 AM and that [place] appt is later that day at 230 PM at different location.   She notes she is going to get supplies this afternoon to start checking her blood glucose. I discussed checking fasting BG and 2 hr PP and recording these values and bringing to her appt tomorrow.   At appointment on 02/19/23: - Needs antibody screen drawn, then Rhogam administered - F/u blood glucose, goal FPG < 90 and 2 hr PP < 120, if > 50% blood glucoses not at goal consider starting metformin - Please remind patient of time and different location of f/u growth [place] at 2:30 pm on 02/19/23 at MFM office  Answered all patient questions and concerns.  Burley Saver MD

## 2023-02-19 ENCOUNTER — Ambulatory Visit (INDEPENDENT_AMBULATORY_CARE_PROVIDER_SITE_OTHER): Payer: Medicaid Other | Admitting: Family Medicine

## 2023-02-19 ENCOUNTER — Ambulatory Visit: Payer: Medicaid Other | Attending: Family Medicine

## 2023-02-19 ENCOUNTER — Ambulatory Visit: Payer: Medicaid Other | Admitting: *Deleted

## 2023-02-19 ENCOUNTER — Other Ambulatory Visit: Payer: Self-pay

## 2023-02-19 ENCOUNTER — Ambulatory Visit: Payer: Self-pay

## 2023-02-19 ENCOUNTER — Telehealth: Payer: Self-pay

## 2023-02-19 VITALS — BP 104/64 | HR 86 | Temp 97.5°F | Wt 170.4 lb

## 2023-02-19 VITALS — BP 107/69 | HR 92

## 2023-02-19 DIAGNOSIS — O360130 Maternal care for anti-D [Rh] antibodies, third trimester, not applicable or unspecified: Secondary | ICD-10-CM | POA: Diagnosis not present

## 2023-02-19 DIAGNOSIS — O24410 Gestational diabetes mellitus in pregnancy, diet controlled: Secondary | ICD-10-CM

## 2023-02-19 DIAGNOSIS — Z3A28 28 weeks gestation of pregnancy: Secondary | ICD-10-CM

## 2023-02-19 DIAGNOSIS — O24419 Gestational diabetes mellitus in pregnancy, unspecified control: Secondary | ICD-10-CM | POA: Diagnosis present

## 2023-02-19 DIAGNOSIS — O98013 Tuberculosis complicating pregnancy, third trimester: Secondary | ICD-10-CM

## 2023-02-19 DIAGNOSIS — A159 Respiratory tuberculosis unspecified: Secondary | ICD-10-CM | POA: Diagnosis not present

## 2023-02-19 DIAGNOSIS — Z6791 Unspecified blood type, Rh negative: Secondary | ICD-10-CM

## 2023-02-19 DIAGNOSIS — O26893 Other specified pregnancy related conditions, third trimester: Secondary | ICD-10-CM

## 2023-02-19 MED ORDER — RHO D IMMUNE GLOBULIN 1500 UNITS IM SOSY
300.0000 ug | PREFILLED_SYRINGE | Freq: Once | INTRAMUSCULAR | Status: DC
Start: 2023-02-19 — End: 2023-02-20

## 2023-02-19 NOTE — Patient Instructions (Addendum)
It was great seeing you today!  Please continue to take your prenatal vitamin and aspirin daily.  Please continue to check your sugars 3 times a day and record this. Bring this to your next visit. Due to you having diabetes with your pregnancy, this requires more close monitoring so I have placed a referral to the [facility] specialist.   Please try to eat a balanced diet, incorporate vegetables as much as possible. Try to stay active as well, walking is a great way to do this.   Your growth ultrasound is later today (5/30) at 2:30 pm.   You will also get rhogam as we discussed based on your previous initial blood work.   Go to the [facility] at [facility] if: You have cramping/contractions that do not go away with drinking water Your water breaks.  Sometimes it is a big gush of fluid, sometimes it is just a trickle that keeps getting your underwear wet or running down your legs You have vaginal bleeding.    You do not feel your baby moving like normal.  If you do not, get something to eat and drink and lay down and focus on feeling your baby move. If your baby is still not moving like normal, you should go to MAU.   Please follow up at your next scheduled appointment on 6/7 at 2:30 pm, if anything arises between now and then, please don't hesitate to contact our office.   Thank you for allowing [place] to be a part of your medical care!  Thank you, Dr. Robyne Peers

## 2023-02-19 NOTE — Telephone Encounter (Signed)
Called the OB office to schedule BPP, I was put on hold greater than 20 mins. I will try again to schedule patient for her appointment. Once it is schedule I will reach out to patient and inform her. Penni Bombard CMA

## 2023-02-19 NOTE — Progress Notes (Signed)
  [facility] Prenatal Visit  Dawn Kennedy is a 32 y.o. G2P0010 at [redacted]w[redacted]d here for routine follow up. She is dated by LMP.  She reports no complaints. She reports fetal movement. She denies vaginal bleeding, contractions, or loss of fluid. See flow sheet for details.  Vitals:   02/19/23 0942  BP: 104/64  Pulse: 86  Temp: (!) 97.5 F (36.4 C)      A/P: Pregnancy at [redacted]w[redacted]d.  Doing well.   Routine prenatal care:  Dating reviewed, dating tab is correct. Fetal heart tones appropriate Fundal height appropriate Infant feeding choice: Breastfeeding  Contraception choice: undecided  Infant circumcision desired not applicable  The patient does not have a history of Cesarean delivery and no referral to [facility] is indicated Influenza vaccine not given as it is not flu season. Tdap was previously given. 1 hour glucola, CBC, RPR, and HIV were previously obtained.   Rh status was reviewed and patient does need Rhogam.  Rhogam was given today. Discussed the indication for this extensively with patient. Antibody screening pending.  Pregnancy medical home and PHQ-9 forms were done today and reviewed.   Childbirth and education classes were offered. Pregnancy education regarding benefits of breastfeeding, contraception, fetal growth, expected weight gain, and safe infant sleep were discussed.  Preterm labor and fetal movement precautions reviewed.  2. Pregnancy issues include the following and were addressed as appropriate today:   Gestational DM: just got her glucose monitoring supplies yesterday and only has 3 glucose levels recorded. Ranging between 109-129. just got tool to check yesterday 109-129. Extensively discussed lifestyle modifications, diet and exercise counseling provided. Close follow up scheduled with [facility] and OB referral placed given GDM. Consider initiation of metformin if appropriate at next visit.   Growth ultrasound scheduled for later this  afternoon, patient aware.   AVS discussed in detail with interpretor.   Problem list and pregnancy box updated: Yes.   Close follow up scheduled on 6/7, please ensure she has OB follow up for prenatal care.  In-person Dari interpretation ([name] Emmit Alexanders) utilized throughout the entirety of this encounter.

## 2023-02-19 NOTE — Progress Notes (Signed)
Rhophylac 1500 IU given in her left upper outer quadrant of gluteus.  [id number]  Lot: [id number]  Exp: 03/21/25

## 2023-02-20 ENCOUNTER — Other Ambulatory Visit: Payer: Self-pay | Admitting: *Deleted

## 2023-02-20 DIAGNOSIS — Z6791 Unspecified blood type, Rh negative: Secondary | ICD-10-CM | POA: Diagnosis not present

## 2023-02-20 DIAGNOSIS — Z3493 Encounter for supervision of normal pregnancy, unspecified, third trimester: Secondary | ICD-10-CM | POA: Diagnosis not present

## 2023-02-20 DIAGNOSIS — O26893 Other specified pregnancy related conditions, third trimester: Secondary | ICD-10-CM

## 2023-02-20 DIAGNOSIS — O24419 Gestational diabetes mellitus in pregnancy, unspecified control: Secondary | ICD-10-CM

## 2023-02-20 DIAGNOSIS — A159 Respiratory tuberculosis unspecified: Secondary | ICD-10-CM

## 2023-02-20 LAB — ANTIBODY SCREEN: Antibody Screen: NEGATIVE

## 2023-02-20 NOTE — Addendum Note (Signed)
Addended by: Penni Bombard on: 02/20/2023 08:31 AM   Modules accepted: Orders

## 2023-02-25 ENCOUNTER — Encounter: Payer: Self-pay | Admitting: Registered"

## 2023-02-25 ENCOUNTER — Encounter: Payer: Medicaid Other | Attending: Family Medicine | Admitting: Registered"

## 2023-02-25 DIAGNOSIS — O24419 Gestational diabetes mellitus in pregnancy, unspecified control: Secondary | ICD-10-CM | POA: Diagnosis present

## 2023-02-25 NOTE — Progress Notes (Signed)
Patient was seen for Gestational Diabetes self-management on 02/25/23  Start time 0915 and End time 1012   Estimated due date: 05/14/23; [redacted]w[redacted]d  Next ob visit 02/27/23   In-person Interpreter Mariane Masters CAPs  Clinical: Medications: aspirin, prenatal vitamin Medical History: no prior diabetes Labs: OGTT      No results found for: "HGBA1C" SMBG: Patient has been checking blood sugar incorrectly, did not understand the timing and frequency of checking. From discussion of her readings she provided her FBS stays in 100's and after meals is 100-150 mg/dL.   Dietary and Lifestyle History: Pt reports when she was first diagnosed she was very stressed, but after talking to friends and family felt better when learned it is common in pregnancy and will go away after delivery.  Pt reports since getting diagnosis changes made: eating meat only a few times a week, less bread and oil, no sweet tea with biscuit. Really enjoys pasta, but limits to once a week.   Pt states she usually eats dinner 9 pm or later but will try to start eating earlier.  Pt states she does not understand why she developed because she was not eating a lot of sweets prior to pregnancy.  Physical Activity: walk for last 3 days 15 min, walking 30 min before dinner. Stress: had a lot of stress at diagnosis, but feels better now. Sleep: not assessed  24 hr Recall:  First Meal: (8:30-9 am); milk, apple (egg, cucumber, tomato) Snack: Second meal: chickpeas Snack: Third meal: (9 pm) green beans, grapes, cherries Snack: Beverages: water, green tea no sugar,   NUTRITION INTERVENTION  Nutrition education (E-1) on the following topics:   Initial Follow-up  [x]  []  Definition of Gestational Diabetes []  []  Why dietary management is important in controlling blood glucose [x]  []  Effects each nutrient has on blood glucose levels []  []  Simple carbohydrates vs complex carbohydrates []  []  Fluid intake [x]  []  Creating a balanced  meal plan [x]  []  Carbohydrate counting (basics) [x]  []  When to check blood glucose levels [x]  []  Proper blood glucose monitoring techniques [x]  []  Effect of stress and stress reduction techniques  [x]  []  Exercise effect on blood glucose levels, appropriate exercise during pregnancy []  []  Importance of limiting caffeine and abstaining from alcohol and smoking [x]  []  Medications used for blood sugar control during pregnancy []  []  Hypoglycemia and rule of 15 []  []  Postpartum self care   Patient instructed to monitor glucose levels: FBS: 60 - ? 95 mg/dL; 2 hour: ? 161 mg/dL  Patient received handouts: Nutrition Diabetes and Pregnancy, Carbohydrate Counting List, healthy eating handout in Farsi  Patient will be seen for follow-up as needed.

## 2023-02-27 ENCOUNTER — Ambulatory Visit (INDEPENDENT_AMBULATORY_CARE_PROVIDER_SITE_OTHER): Payer: Medicaid Other | Admitting: Family Medicine

## 2023-02-27 VITALS — BP 102/72 | HR 89 | Wt 170.2 lb

## 2023-02-27 DIAGNOSIS — Z3483 Encounter for supervision of other normal pregnancy, third trimester: Secondary | ICD-10-CM

## 2023-02-27 DIAGNOSIS — O24419 Gestational diabetes mellitus in pregnancy, unspecified control: Secondary | ICD-10-CM

## 2023-02-27 MED ORDER — METFORMIN HCL ER 500 MG PO TB24
500.0000 mg | ORAL_TABLET | Freq: Every day | ORAL | 0 refills | Status: DC
Start: 2023-02-27 — End: 2023-04-08

## 2023-02-27 NOTE — Progress Notes (Signed)
    SUBJECTIVE:   CHIEF COMPLAINT / HPI:   Patient is [redacted]w[redacted]d dated by LMP with pregnancy complicated with newly diagnosed gestational diabetes. Has been checking her glucose levels 4 times a day which have been ranging around 100-150. Average range seems to be most often within 100-120 range with the highest being 150-155 on 2 occasions since the last visit. Fasting glucose levels range between 100-110.  Recently saw the nutritionist and DM is being managed with diet thus far. Appointment went well and she got a lot of recommendations. Eating healthy, including more vegetables. Does not have a history of chronic DM.  Denies vaginal bleeding, gush or leakage or fluid and contractions. Denies abdominal or pelvic pain. Endorses baby movement.   OBJECTIVE:   BP 102/72   Pulse 89   Wt 170 lb 4 oz (77.2 kg)   LMP 08/07/2022   BMI 32.17 kg/m   General: Patient well-appearing, in no acute distress. Resp: normal work of breathing noted Psych: mood and affect appropriate, very pleasant   ASSESSMENT/PLAN:   Gestational diabetes mellitus -glucose levels seem to be consistently elevated above desired range, will initiate pharmacotherapy. Prescribed metformin 500 mg daily. -continue to emphasize lifestyle modifications including diet and exercise  -continue prenatal vitamin and aspirin daily -MAU precautions discussed and provided -given recently diagnosed GDM, patient requires prenatal care follow up with OB which is scheduled on 6/28 along with her ultrasound. Until then, close follow up scheduled with [place], next appointment on 6/13 which patient is aware of     Reece Leader, DO [facility]

## 2023-02-27 NOTE — Patient Instructions (Addendum)
It was great seeing you today!  Today we discussed your diabetes, it seems that your sugar levels are higher than we would want. I have prescribed metformin 500 mg daily. Please take this everyday. Please continue to eat a healthy diet.   Go to the [facility]U at [facility] if: You begin to have strong, frequent contractions Your water breaks.  Sometimes it is a big gush of fluid, sometimes it is just a trickle that keeps getting your underwear wet or running down your legs You have vaginal bleeding.  It is normal to have a small amount of spotting if your cervix was checked.  You do not feel your baby moving like normal.  If you do not, get something to eat and drink and lay down and focus on feeling your baby move.   If your baby is still not moving like normal, you should go to MAU.   Please follow up at your next scheduled appointment on 6/13 at 10:10 am, if anything arises between now and then, please don't hesitate to contact our office.   Thank you for allowing [place] to be a part of your medical care!  Thank you, Dr. Robyne Peers

## 2023-02-27 NOTE — Assessment & Plan Note (Signed)
-  glucose levels seem to be consistently elevated above desired range, will initiate pharmacotherapy. Prescribed metformin 500 mg daily. -continue to emphasize lifestyle modifications including diet and exercise  -continue prenatal vitamin and aspirin daily -MAU precautions discussed and provided -given recently diagnosed GDM, patient requires prenatal care follow up with OB which is scheduled on 6/28 along with her ultrasound. Until then, close follow up scheduled with [place], next appointment on 6/13 which patient is aware of

## 2023-03-04 NOTE — Progress Notes (Signed)
  [facility] Prenatal Visit  In person Dari interpreter Hafza present for entirety of exam.   Dawn Kennedy is a 32 y.o. G2P0010 at [redacted]w[redacted]d here for routine follow up. She is dated by LMP.  She reports no complaints. She reports fetal movement. She denies vaginal bleeding, contractions, or loss of fluid. See flow sheet for details.  Vitals:   03/05/23 1014  BP: 103/66  Pulse: 85  Temp: 97.7 F (36.5 C)   Started metformin two or days ago.  Had trouble getting at pharmacy? At first, they asked for $52 But then when run through [facility] insurance, was free of charge  FSBS readings reviewed. Patient taking post-prandial measurements two hours after eating as instructed. Appears to be checking appropriately.  Fasting: 104-154 (goal <95) After breakfast: 99-149 (goal <120) After lunch: 88-161 After dinner: 125-147  Has been eating more veggies and protein  Less carbs  A/P: Pregnancy at [redacted]w[redacted]d.  Doing well.   Routine prenatal care:  Dating reviewed, dating tab is correct Fetal heart tones Appropriate - 146 Fundal height >2 cm from expected size given dating, discussed with preceptor.  33 cm Infant feeding choice: Breastfeeding Contraception choice: Undecided  Infant circumcision desired not applicable The patient does not have a history of Cesarean delivery and no referral to [facility] for [facility] is indicated Influenza vaccine previously administered.  @ 12/15/2022 Tdap was given previously @ 02/12/23 1 hour glucola, CBC, RPR, and HIV were previously obtained on 02/12/23:  1 hr GTT showed GDM CBC normal  HIV and RPR negative  Rh status was reviewed and patient does need Rhogam.  Rhogam was previously administered on 02/19/23 (@ [redacted] weeks gestation) Childbirth and education classes were offered. Pregnancy education regarding benefits of breastfeeding, contraception, fetal growth, expected weight gain, and safe infant sleep were discussed.  Preterm labor and fetal  movement precautions reviewed.  2. Pregnancy issues include the following and were addressed as appropriate today:  Problem list and pregnancy box updated: Yes.  Continue PNV and aspirin GDM: Tolerating metformin well. Blood sugars uncontrolled, but likely because patient just started metformin 2 days ago. Encouraged her to continue metformin as prescribed.   Patient scheduled in high risk OB on June 28th.   Fayette Pho, MD

## 2023-03-04 NOTE — Patient Instructions (Signed)
I have translated the following text using Google translate.  As such, there are many errors.  I apologize for the poor written translation; however, we do not have written  translation services yet. It was wonderful to meet you today. Thank you for allowing me to be a part of your care. Below is a short summary of what we discussed at your visit today: ??? ??? ?? ?? ??????? ?? ???? ??????? ????? ???? ??. ?? ??? ?????? ?????? ????? ???? ????. ???? ????? ???? ??????? ???? ?? ????. ?? ??? ???? ?? ???? ????? ????? ????? ??????. ????? ???? ??? ??? ?????? ???. ????? ?? ?? ?? ????? ???? ?? ???? ?? ?????? ??? ????. ?? ??? ????? ?????? ?? ???? ?? ?????? ????? ??? ???? ??? ???? ?????? ????? ??? ???:  Prenatal care Continue taking your prenatal vitamin and aspirin 81 mg every day.  Continue taking your metformin every day as prescribed.  You are scheduled with the obstetrician on June 28. See the next page for more details.  ?????? ??? ????? ??????? ?? ???? ?????? 81 ???? ??? ??????? ? ?????? ????? ??????? ??? ????? ????. ???? ???????? ??? ?? ?? ??? ??? ????? ????? ????. ??? ?? ????? ???? ? ?????? ?? 28 ???? ?????? ???? ??? ???. ???? ?????? ????? ?? ???? ???? ?????? ????.  Prenatal Classes Go to [email or web address] for more information on the pregnancy and child birth classes that [facility] has to offer.  ???? ??? ??? ?? ???? ???? ??????? ????? ?? ???? ???? ??? ??????? ? ?????? ?? ???? [place] ????? ?? ???? ?? [email or web address] ?????? ????.  Emergency Planning If you experience any vaginal bleeding, leakage of fluids, don't feel your baby moving as much, or start to have contractions less than 5 minutes apart please go directly to the Maternal Assessment Unit at [facility] for evaluation.  Maternity and women's care services located on the [place] side of [facility] [place]. [facility] (Entrance [address] off [address]   [address] [place],  [place]  [address] ?????? ???? ??????? ??? ??????? ???????? ??? ?????? ?? ????? ?????? ????? ??????? ??????? ?? ???????? ???? ?????? ?? ???? ?? ???????? ?? ????? ???? ?? 5 ????? ?????? ????? ???? ??????? ???????? ?? ???? ??????? ???? ?? ????????? ???? ??? ?????? ????.  ????? ?????? ? ?????? ?? ???? ???? ?? ??? ????? ????????? ?????? ???? ??. ??? (????? C ?? ?????? ???? ???). ????? ?????? ?????? ????? [address] ?????????? [place] [id number]     Fayette Pho, MD

## 2023-03-05 ENCOUNTER — Other Ambulatory Visit: Payer: Self-pay

## 2023-03-05 ENCOUNTER — Ambulatory Visit (INDEPENDENT_AMBULATORY_CARE_PROVIDER_SITE_OTHER): Payer: Medicaid Other | Admitting: Family Medicine

## 2023-03-05 VITALS — BP 103/66 | HR 85 | Temp 97.7°F | Wt 172.4 lb

## 2023-03-05 DIAGNOSIS — Z3A30 30 weeks gestation of pregnancy: Secondary | ICD-10-CM

## 2023-03-05 DIAGNOSIS — Z3483 Encounter for supervision of other normal pregnancy, third trimester: Secondary | ICD-10-CM

## 2023-03-20 ENCOUNTER — Ambulatory Visit: Payer: Medicaid Other | Admitting: *Deleted

## 2023-03-20 ENCOUNTER — Ambulatory Visit: Payer: Medicaid Other | Attending: Obstetrics and Gynecology

## 2023-03-20 ENCOUNTER — Other Ambulatory Visit: Payer: Self-pay | Admitting: *Deleted

## 2023-03-20 DIAGNOSIS — O98813 Other maternal infectious and parasitic diseases complicating pregnancy, third trimester: Secondary | ICD-10-CM

## 2023-03-20 DIAGNOSIS — O24419 Gestational diabetes mellitus in pregnancy, unspecified control: Secondary | ICD-10-CM | POA: Diagnosis present

## 2023-03-20 DIAGNOSIS — A159 Respiratory tuberculosis unspecified: Secondary | ICD-10-CM

## 2023-03-20 DIAGNOSIS — Z6791 Unspecified blood type, Rh negative: Secondary | ICD-10-CM | POA: Insufficient documentation

## 2023-03-20 DIAGNOSIS — O26893 Other specified pregnancy related conditions, third trimester: Secondary | ICD-10-CM | POA: Diagnosis present

## 2023-03-20 DIAGNOSIS — O24415 Gestational diabetes mellitus in pregnancy, controlled by oral hypoglycemic drugs: Secondary | ICD-10-CM | POA: Diagnosis not present

## 2023-03-20 DIAGNOSIS — O360130 Maternal care for anti-D [Rh] antibodies, third trimester, not applicable or unspecified: Secondary | ICD-10-CM | POA: Diagnosis not present

## 2023-03-20 DIAGNOSIS — Z3A32 32 weeks gestation of pregnancy: Secondary | ICD-10-CM

## 2023-03-24 ENCOUNTER — Ambulatory Visit: Payer: Medicaid Other | Attending: Maternal & Fetal Medicine | Admitting: *Deleted

## 2023-03-24 DIAGNOSIS — Z3A32 32 weeks gestation of pregnancy: Secondary | ICD-10-CM | POA: Insufficient documentation

## 2023-03-24 DIAGNOSIS — O24415 Gestational diabetes mellitus in pregnancy, controlled by oral hypoglycemic drugs: Secondary | ICD-10-CM

## 2023-03-24 DIAGNOSIS — O24419 Gestational diabetes mellitus in pregnancy, unspecified control: Secondary | ICD-10-CM | POA: Insufficient documentation

## 2023-03-24 NOTE — Procedures (Signed)
Dawn Kennedy 07-09-1991 [redacted]w[redacted]d  Fetus A Non-Stress Test Interpretation for 03/24/23  Indication: Gestational Diabetes medication controlled  Fetal Heart Rate A Mode: External Baseline Rate (A): 140 bpm Variability: Moderate Accelerations: 15 x 15 Decelerations: None Multiple birth?: No  Uterine Activity Mode: Palpation, Toco Contraction Frequency (min): 1 UC Contraction Quality: Mild Resting Tone Palpated: Relaxed Resting Time: Adequate  Interpretation (Fetal Testing) Nonstress Test Interpretation: Reactive Comments: Dr. Judeth Cornfield reviewed tracing.

## 2023-04-03 ENCOUNTER — Ambulatory Visit: Payer: Medicaid Other | Attending: Maternal & Fetal Medicine | Admitting: *Deleted

## 2023-04-03 DIAGNOSIS — Z3A34 34 weeks gestation of pregnancy: Secondary | ICD-10-CM | POA: Insufficient documentation

## 2023-04-03 DIAGNOSIS — O24419 Gestational diabetes mellitus in pregnancy, unspecified control: Secondary | ICD-10-CM

## 2023-04-03 DIAGNOSIS — O24415 Gestational diabetes mellitus in pregnancy, controlled by oral hypoglycemic drugs: Secondary | ICD-10-CM | POA: Insufficient documentation

## 2023-04-03 NOTE — Procedures (Signed)
Dawn Kennedy 12-Jul-1991 [redacted]w[redacted]d  Fetus A Non-Stress Test Interpretation for 04/03/23--nst only  Indication: Gestational Diabetes medication controlled  Fetal Heart Rate A Mode: External Baseline Rate (A): 150 bpm Variability: Moderate Accelerations: 15 x 15 Decelerations: None Multiple birth?: No  Uterine Activity Mode: Toco Contraction Frequency (min): none Resting Tone Palpated: Relaxed  Interpretation (Fetal Testing) Nonstress Test Interpretation: Reactive Comments: Tracing reviewed byDr. Judeth Cornfield

## 2023-04-06 ENCOUNTER — Encounter: Payer: No Typology Code available for payment source | Admitting: Obstetrics and Gynecology

## 2023-04-07 ENCOUNTER — Ambulatory Visit (INDEPENDENT_AMBULATORY_CARE_PROVIDER_SITE_OTHER): Payer: Medicaid Other | Admitting: Student

## 2023-04-07 VITALS — BP 101/61 | HR 83 | Ht 61.0 in | Wt 177.0 lb

## 2023-04-07 DIAGNOSIS — R0781 Pleurodynia: Secondary | ICD-10-CM

## 2023-04-07 DIAGNOSIS — O24419 Gestational diabetes mellitus in pregnancy, unspecified control: Secondary | ICD-10-CM

## 2023-04-07 MED ORDER — CYCLOBENZAPRINE HCL 5 MG PO TABS
5.0000 mg | ORAL_TABLET | Freq: Three times a day (TID) | ORAL | 0 refills | Status: DC | PRN
Start: 2023-04-07 — End: 2023-04-29

## 2023-04-07 NOTE — Progress Notes (Unsigned)
  SUBJECTIVE:   CHIEF COMPLAINT / HPI:   Dari interpreter utilized in person  Left Lower Rib Pain:  G2P0010 [redacted]w[redacted]d with A2GDM p/f pain in left ribs onset 5 days Pain with very deep breathing intermittently No chest pain, notes normal shortness of breath that she has had throughout her pregnancy  Notes worse with bending down and sitting, when sleeping on opposite sides  Has tried Tylenol x2 occasions for the pain  Denies systemic symptoms  No trauma to cause the onset  Pain with movements  No rash   Fasting sugars in 100s-110, postprandial 120 Has appt with OBGYN in 2 days  Reports that she takes 2 Metformin 500 mg tabs daily as instructed by doctor who performed ultrasound    PERTINENT  PMH / PSH:  A2GDM Current pregnancy  Patient Care Team: Alfredo Martinez, MD as PCP - General (Family Medicine) Nestor Ramp, MD as PCP - OBGYN (Family Medicine) OBJECTIVE:  BP 101/61   Pulse 83   Ht 5\' 1"  (1.549 m)   Wt 177 lb (80.3 kg)   LMP 08/07/2022   SpO2 97%   BMI 33.44 kg/m  Physical Exam  General: Alert and oriented in no apparent distress; interpreter in chair in room  Heart: Regular rate and rhythm with no murmurs appreciated Lungs: CTA bilaterally, no wheezing Abdomen: gravid uterus, no abdominal pain Left sided rib TTP without rash or skin discoloration  Skin: Warm and dry, no rash  Extremities: No lower extremity edema  ASSESSMENT/PLAN:  Rib pain Assessment & Plan: Seems MSK in nature given positional pain and presentation. Low risk for respiratory etiology with no increased dyspnea, no infectious symptoms, or change in vital signs. Sees OB in 2 days.   Orders: -     Cyclobenzaprine HCl; Take 1 tablet (5 mg total) by mouth 3 (three) times daily as needed for muscle spasms (Take for severe breakthrough pain).  Dispense: 8 tablet; Refill: 0  Gestational diabetes mellitus (GDM) in third trimester, gestational diabetes method of control unspecified Assessment &  Plan: Increase metformin to two tabs in AM and one tab in the PM.  Seeing OB for high risk A2GDM in 2 days  Check glucose fasting and 2 hr postprandial.   Orders: -     metFORMIN HCl ER; Take 1 tablet (500 mg total) by mouth 3 (three) times daily. Two tabs in AM and 1 tab in PM  Dispense: 180 tablet; Refill: 0   Return for OB visit . Alfredo Martinez, MD 04/08/2023, 1:13 PM PGY-3, [facility] Family [facility]

## 2023-04-07 NOTE — Patient Instructions (Addendum)
It was great to see you today! Thank you for choosing [facility] for your primary care. Dawn Kennedy was seen for shoulder pain.  Today we addressed: Use the Flexeril sparingly, only as needed for severe pain once a day Please see your OBGYN in two days  Keep taking the tylenol as needed, using your patches for pain and the gel you have is fine  Take Metformin two times in AM and once in PM  If you haven't already, sign up for My Chart to have easy access to your labs results, and communication with your primary care physician.  I recommend that you always bring your medications to each appointment as this makes it easy to ensure you are on the correct medications and helps [place] not miss refills when you need them. Call the clinic at [phone] if your symptoms worsen or you have any concerns.  You should return to our clinic Return for OB visit . Please arrive 15 minutes before your appointment to ensure smooth check in process.  We appreciate your efforts in making this happen.  Thank you for allowing me to participate in your care, Alfredo Martinez, MD 04/07/2023, 2:29 PM PGY-3, [facility] Family Medicine

## 2023-04-08 ENCOUNTER — Other Ambulatory Visit: Payer: Self-pay | Admitting: Student

## 2023-04-08 DIAGNOSIS — O24419 Gestational diabetes mellitus in pregnancy, unspecified control: Secondary | ICD-10-CM

## 2023-04-08 DIAGNOSIS — R0781 Pleurodynia: Secondary | ICD-10-CM | POA: Insufficient documentation

## 2023-04-08 MED ORDER — METFORMIN HCL ER 500 MG PO TB24
500.0000 mg | ORAL_TABLET | Freq: Three times a day (TID) | ORAL | 0 refills | Status: DC
Start: 2023-04-08 — End: 2023-04-09

## 2023-04-08 NOTE — Progress Notes (Addendum)
   PRENATAL VISIT NOTE  Subjective:  Dawn Kennedy is a 32 y.o. G2P0010 at [redacted]w[redacted]d being seen today for ongoing prenatal care.  She is currently monitored for the following issues for this high-risk pregnancy and has Latent tuberculosis by blood test; Pregnancy; Rh negative status during pregnancy in third trimester; Gestational diabetes mellitus; and Rib pain on their problem list.  Patient reports backache.  Contractions: Irritability. Vag. Bleeding: None.  Movement: Present. Denies leaking of fluid.   The following portions of the patient's history were reviewed and updated as appropriate: allergies, current medications, past family history, past medical history, past social history, past surgical history and problem list.   Objective:   Vitals:   04/09/23 1457  BP: 112/72  Pulse: 87  Weight: 176 lb (79.8 kg)    Fetal Status: Fetal Heart Rate (bpm): 154   Movement: Present     General:  Alert, oriented and cooperative. Patient is in no acute distress.  Skin: Skin is warm and dry. No rash noted.   Cardiovascular: Normal heart rate noted  Respiratory: Normal respiratory effort, no problems with respiration noted  Abdomen: Soft, gravid, appropriate for gestational age.  Pain/Pressure: Present     Pelvic: Cervical exam deferred        Extremities: Normal range of motion.  Edema: Trace  Mental Status: Normal mood and affect. Normal behavior. Normal judgment and thought content.   Assessment and Plan:  Pregnancy: G2P0010 at [redacted]w[redacted]d 1. Gestational diabetes mellitus (GDM), antepartum, gestational diabetes method of control unspecified Current regimen: MTF 500 bid CBG review: Fastings still elevated and meals are 120-140s per pt report.  Regimen changes:  MTF 1000 bid Growth [place]: 6/28 - 67%ile, normal afi and AC 88%ile Antenatal monitoring:  Weekly BPP.  Other needs:  Determine timing of delivery based on next growth on 7/24- 37w vs 39w.   2. Rh negative status during pregnancy in third  trimester S/p rhogam  3. [redacted] weeks gestation of pregnancy Routine PNC up to date Cultures today.  Belly band given today   Preterm labor symptoms and general obstetric precautions including but not limited to vaginal bleeding, contractions, leaking of fluid and fetal movement were reviewed in detail with the patient. Please refer to After Visit Summary for other counseling recommendations.   No follow-ups on file.  Future Appointments  Date Time Provider Department Center  04/10/2023  3:15 PM [facility] NST [facility]  04/15/2023  2:30 PM [facility] US3 [facility]    Milas Hock, MD

## 2023-04-08 NOTE — Assessment & Plan Note (Signed)
Seems MSK in nature given positional pain and presentation. Low risk for respiratory etiology with no increased dyspnea, no infectious symptoms, or change in vital signs. Sees OB in 2 days.

## 2023-04-08 NOTE — Assessment & Plan Note (Signed)
Increase metformin to two tabs in AM and one tab in the PM.  Seeing OB for high risk A2GDM in 2 days  Check glucose fasting and 2 hr postprandial.

## 2023-04-09 ENCOUNTER — Ambulatory Visit (INDEPENDENT_AMBULATORY_CARE_PROVIDER_SITE_OTHER): Payer: Medicaid Other | Admitting: Obstetrics and Gynecology

## 2023-04-09 ENCOUNTER — Other Ambulatory Visit: Payer: Self-pay

## 2023-04-09 ENCOUNTER — Other Ambulatory Visit (HOSPITAL_COMMUNITY)
Admission: RE | Admit: 2023-04-09 | Discharge: 2023-04-09 | Disposition: A | Discharge status: Home / Self Care | Payer: Medicaid Other | Source: Ambulatory Visit | Attending: Obstetrics and Gynecology | Admitting: Obstetrics and Gynecology

## 2023-04-09 ENCOUNTER — Encounter: Payer: Self-pay | Admitting: Obstetrics and Gynecology

## 2023-04-09 VITALS — BP 112/72 | HR 87 | Wt 176.0 lb

## 2023-04-09 DIAGNOSIS — O24419 Gestational diabetes mellitus in pregnancy, unspecified control: Secondary | ICD-10-CM | POA: Diagnosis not present

## 2023-04-09 DIAGNOSIS — Z3A35 35 weeks gestation of pregnancy: Secondary | ICD-10-CM

## 2023-04-09 DIAGNOSIS — O26893 Other specified pregnancy related conditions, third trimester: Secondary | ICD-10-CM | POA: Diagnosis not present

## 2023-04-09 DIAGNOSIS — Z6791 Unspecified blood type, Rh negative: Secondary | ICD-10-CM | POA: Diagnosis not present

## 2023-04-09 DIAGNOSIS — Z3483 Encounter for supervision of other normal pregnancy, third trimester: Secondary | ICD-10-CM | POA: Insufficient documentation

## 2023-04-09 MED ORDER — METFORMIN HCL 500 MG PO TABS
1000.0000 mg | ORAL_TABLET | Freq: Two times a day (BID) | ORAL | 5 refills | Status: DC
Start: 2023-04-09 — End: 2023-04-29

## 2023-04-10 ENCOUNTER — Ambulatory Visit: Payer: Medicaid Other | Attending: Maternal & Fetal Medicine | Admitting: *Deleted

## 2023-04-10 DIAGNOSIS — O24410 Gestational diabetes mellitus in pregnancy, diet controlled: Secondary | ICD-10-CM

## 2023-04-10 DIAGNOSIS — Z3A35 35 weeks gestation of pregnancy: Secondary | ICD-10-CM | POA: Insufficient documentation

## 2023-04-10 DIAGNOSIS — O24419 Gestational diabetes mellitus in pregnancy, unspecified control: Secondary | ICD-10-CM | POA: Diagnosis not present

## 2023-04-10 LAB — CERVICOVAGINAL ANCILLARY ONLY
Chlamydia: NEGATIVE
Comment: NEGATIVE
Comment: NORMAL
Neisseria Gonorrhea: NEGATIVE

## 2023-04-10 NOTE — Procedures (Signed)
Elliannah Wayment 1991-07-02 [redacted]w[redacted]d  Fetus A Non-Stress Test Interpretation for 04/10/23--nstonly  Indication: Gestational Diabetes medication controlled  Fetal Heart Rate A Mode: External Baseline Rate (A): 150 bpm Variability: Moderate Accelerations: 15 x 15 Decelerations: None Multiple birth?: No  Uterine Activity Mode: Toco Contraction Frequency (min): NONE Resting Tone Palpated: Relaxed  Interpretation (Fetal Testing) Nonstress Test Interpretation: Reactive Comments: TRACING REVIEWED BY DR fang

## 2023-04-10 NOTE — Procedures (Signed)
Dawn Kennedy 04/19/1991 [redacted]w[redacted]d  Fetus A Non-Stress Test Interpretation for 04/10/23--NST ONLY  Indication: Gestational Diabetes medication controlled  Fetal Heart Rate A Mode: External Baseline Rate (A): 150 bpm Variability: Moderate Accelerations: 15 x 15 Decelerations: None Multiple birth?: No  Uterine Activity Mode: Toco Contraction Frequency (min): NONE Resting Tone Palpated: Relaxed  Interpretation (Fetal Testing) Nonstress Test Interpretation: Reactive Comments: TRACING REVIEWED BY DR Judeth Cornfield

## 2023-04-11 LAB — STREP GP B NAA: Strep Gp B NAA: NEGATIVE

## 2023-04-15 ENCOUNTER — Ambulatory Visit (INDEPENDENT_AMBULATORY_CARE_PROVIDER_SITE_OTHER): Payer: Medicaid Other | Admitting: Family Medicine

## 2023-04-15 ENCOUNTER — Ambulatory Visit: Payer: Medicaid Other | Attending: Maternal & Fetal Medicine

## 2023-04-15 VITALS — BP 120/85 | HR 84 | Wt 179.6 lb

## 2023-04-15 DIAGNOSIS — O24419 Gestational diabetes mellitus in pregnancy, unspecified control: Secondary | ICD-10-CM | POA: Insufficient documentation

## 2023-04-15 DIAGNOSIS — O24415 Gestational diabetes mellitus in pregnancy, controlled by oral hypoglycemic drugs: Secondary | ICD-10-CM | POA: Diagnosis not present

## 2023-04-15 DIAGNOSIS — Z6791 Unspecified blood type, Rh negative: Secondary | ICD-10-CM | POA: Diagnosis not present

## 2023-04-15 DIAGNOSIS — O26893 Other specified pregnancy related conditions, third trimester: Secondary | ICD-10-CM | POA: Diagnosis not present

## 2023-04-15 DIAGNOSIS — O360130 Maternal care for anti-D [Rh] antibodies, third trimester, not applicable or unspecified: Secondary | ICD-10-CM

## 2023-04-15 DIAGNOSIS — Z3A35 35 weeks gestation of pregnancy: Secondary | ICD-10-CM

## 2023-04-15 DIAGNOSIS — Z227 Latent tuberculosis: Secondary | ICD-10-CM

## 2023-04-15 DIAGNOSIS — O3663X0 Maternal care for excessive fetal growth, third trimester, not applicable or unspecified: Secondary | ICD-10-CM | POA: Diagnosis not present

## 2023-04-15 NOTE — Patient Instructions (Signed)
Induction of Labor Process for [employer] [facility]  In Fall 2020 [facility] changed it's process for scheduling inductions of labor to create more induction slots and to make sure patients get COVID-19 testing in advance. After you have been tested you need to quarantine so that you do not get infected after your test. You should not go anywhere after your test except necessary medical appointments.  You have been scheduled for induction of labor on 04/25/2023 at midnight. Although you may have a specific time listed on your After Visit Summary or MyChart, we cannot predict when your room will be available. Please disregard this time. A Labor and Delivery staff member will call you on the day that you are scheduled when your room is available. You will need to arrive within one hour of being called. If you do not arrive within this time frame, the next person on the list will be called in and you will move down the list. You may eat a light meal before coming to the hospital. If you go into labor, think your water has broken, experience bright red bleeding or don't feel your baby moving as much as usual before your induction, please call your Ob/Gyn's office or come to Entrance C, Maternity Assessment Unit for evaluation.  Thank you,  Center for [employer]

## 2023-04-15 NOTE — Assessment & Plan Note (Signed)
Rh w/up pp

## 2023-04-15 NOTE — Assessment & Plan Note (Addendum)
Pt does not have CBG logs today. Ultrasound today showed EFW 96%, 3410 g, AFI 11.67.  Recommendation for delivery at 37 weeks.  IOL scheduled 04/25/23 mn

## 2023-04-15 NOTE — Progress Notes (Addendum)
PRENATAL VISIT NOTE  Subjective:  Dawn Kennedy is a 32 y.o. G2P0010 at [redacted]w[redacted]d being seen today for ongoing prenatal care.  She is currently monitored for the following issues for this high-risk pregnancy and has Latent tuberculosis by blood test; Pregnancy; Rh negative status during pregnancy in third trimester; Gestational diabetes mellitus; and Rib pain on their problem list.  Patient reports backache.  Contractions: Not present. Vag. Bleeding: None.  Movement: Present. Denies leaking of fluid.   The following portions of the patient's history were reviewed and updated as appropriate: allergies, current medications, past family history, past medical history, past social history, past surgical history and problem list.   Objective:   Vitals:   04/15/23 1539  BP: 120/85  Pulse: 84  Weight: 179 lb 9.6 oz (81.5 kg)    Fetal Status: Fetal Heart Rate (bpm): 157 Fundal Height: 36 cm Movement: Present     General:  Alert, oriented and cooperative. Patient is in no acute distress.  Skin: Skin is warm and dry. No rash noted.   Cardiovascular: Normal heart rate noted  Respiratory: Normal respiratory effort, no problems with respiration noted  Abdomen: Soft, gravid, appropriate for gestational age.  Pain/Pressure: Present     Pelvic: Cervical exam deferred        Extremities: Normal range of motion.  Edema: Trace  Mental Status: Normal mood and affect. Normal behavior. Normal judgment and thought content.   Assessment and Plan:  Pregnancy: G2P0010 at [redacted]w[redacted]d  Dawn Kennedy was seen today for routine prenatal visit.  [redacted] weeks gestation of pregnancy Overview:  NURSING  PROVIDER  Office Location [facility] Dating by LMP c/w U/S at 22.6 wks    Anatomy U/S  5/30 normal, EFW 52%ile  Initiated care at  Dawn Kennedy but also speaks English              LAB RESULTS   Support Person Partner  Genetics NIPS:             AFP:                NT/IT (FT only)     Carrier Screen [place]:   Rhogam  AB/Negative/-- (12/19 1350) Given 5/30 A1C/GTT Early: 1hr 162; 3 hr normal             Third trimester:  Flu Vaccine 12/15/22    TDaP Vaccine  02/12/23 Blood Type AB/Negative/-- (12/19 1350)  Covid Vaccine  Antibody Negative (12/19 1350)    Rubella 2.32 (12/19 1350)  Feeding Plan Breast RPR Non Reactive (12/19 1350)  Contraception Undecided HBsAg Negative (12/19 1350)  Circumcision N/A HIV Non Reactive (12/19 1350)  Pediatrician  Dawn Kennedy HCVAb Non Reactive (12/19 1350)  Prenatal Classes Info given      Pap Diagnosis  Date Value Ref Range Status  11/24/2022   Final   - Negative for intraepithelial lesion or malignancy (NILM)    BTLConsent  GC/CT Initial:    negative         36wks:  VBAC  Consent  GBS   For PCN allergy, check sensitivities   Aspirin indicated? Yes, started     DME Rx [ ]  BP cuff [ ]  Weight Scale Waterbirth  [ ]  Class [ ]  Consent [ ]  CNM visit  PHQ9 & GAD7 [  ] new OB [  ] 28 weeks  [  ] 36 weeks Induction  [ ]   Orders Entered [ ] Foley Y/N      Rh negative status during pregnancy in third trimester Overview: Rhogam administered 5/30  Assessment & Plan: Rh w/up pp   Gestational diabetes mellitus (GDM) in third trimester controlled on oral hypoglycemic drug Assessment & Plan: Pt does not have CBG logs today. Ultrasound today showed EFW 96%, 3410 g, AFI 11.67.  Recommendation for delivery at 37 weeks.  IOL scheduled 04/25/23 mn      Preterm labor symptoms and general obstetric precautions including but not limited to vaginal bleeding, contractions, leaking of fluid and fetal movement were reviewed in detail with the patient. Please refer to After Visit Summary for other counseling recommendations.    Future Appointments  Date Time Provider Department [facility]  04/23/2023  1:15 PM Donna Bernard [facility]  04/30/2023  9:35 AM Reva Bores, MD [facility]    Dawn Hawk, DO FMOB Fellow, Faculty practice [facility], [facility] 04/15/23  4:10 PM

## 2023-04-18 ENCOUNTER — Other Ambulatory Visit: Payer: Self-pay | Admitting: Advanced Practice Midwife

## 2023-04-18 DIAGNOSIS — O24419 Gestational diabetes mellitus in pregnancy, unspecified control: Secondary | ICD-10-CM

## 2023-04-21 ENCOUNTER — Telehealth (HOSPITAL_COMMUNITY): Payer: Self-pay | Admitting: *Deleted

## 2023-04-21 NOTE — Telephone Encounter (Signed)
Preadmission screen Interpreter number [phone]

## 2023-04-22 ENCOUNTER — Encounter: Payer: No Typology Code available for payment source | Admitting: Obstetrics and Gynecology

## 2023-04-23 ENCOUNTER — Ambulatory Visit (INDEPENDENT_AMBULATORY_CARE_PROVIDER_SITE_OTHER): Payer: Medicaid Other | Admitting: Family Medicine

## 2023-04-23 VITALS — BP 121/81 | HR 83 | Wt 178.2 lb

## 2023-04-23 DIAGNOSIS — Z3A37 37 weeks gestation of pregnancy: Secondary | ICD-10-CM

## 2023-04-23 DIAGNOSIS — O26893 Other specified pregnancy related conditions, third trimester: Secondary | ICD-10-CM

## 2023-04-23 DIAGNOSIS — O24415 Gestational diabetes mellitus in pregnancy, controlled by oral hypoglycemic drugs: Secondary | ICD-10-CM | POA: Diagnosis not present

## 2023-04-23 DIAGNOSIS — Z6791 Unspecified blood type, Rh negative: Secondary | ICD-10-CM | POA: Diagnosis not present

## 2023-04-23 DIAGNOSIS — Z3480 Encounter for supervision of other normal pregnancy, unspecified trimester: Secondary | ICD-10-CM

## 2023-04-23 NOTE — Progress Notes (Signed)
   PRENATAL VISIT NOTE  Subjective:  Dawn Kennedy is a 32 y.o. G2P0010 at [redacted]w[redacted]d being seen today for ongoing prenatal care.  She is currently monitored for the following issues for this high-risk pregnancy and has Latent tuberculosis by blood test; Pregnancy; Rh negative status during pregnancy in third trimester; Gestational diabetes mellitus; and Rib pain on their problem list.  Patient reports no complaints.  Contractions: Irritability. Vag. Bleeding: None.  Movement: Present. Denies leaking of fluid.   The following portions of the patient's history were reviewed and updated as appropriate: allergies, current medications, past family history, past medical history, past social history, past surgical history and problem list.   Objective:   Vitals:   04/23/23 1340  BP: 121/81  Pulse: 83  Weight: 178 lb 3.2 oz (80.8 kg)    Fetal Status: Fetal Heart Rate (bpm): 143   Movement: Present     General:  Alert, oriented and cooperative. Patient is in no acute distress.  Skin: Skin is warm and dry. No rash noted.   Cardiovascular: Normal heart rate noted  Respiratory: Normal respiratory effort, no problems with respiration noted  Abdomen: Soft, gravid, appropriate for gestational age.  Pain/Pressure: Absent     Pelvic: Cervical exam deferred        Extremities: Normal range of motion.  Edema: None  Mental Status: Normal mood and affect. Normal behavior. Normal judgment and thought content.   Assessment and Plan:  Pregnancy: G2P0010 at [redacted]w[redacted]d 1. Supervision of other normal pregnancy, antepartum   2. Gestational diabetes mellitus (GDM) controlled on oral hypoglycemic drug, antepartum Did not bring log today. States her blood sugars are 96-118. IOL scheduled 8/3.   3. Rh negative status during pregnancy in third trimester Rhogam previously given at beginning of 3rd trimester  4. [redacted] weeks gestation of pregnancy IOL in 2 days.   Term labor symptoms and general obstetric precautions  including but not limited to vaginal bleeding, contractions, leaking of fluid and fetal movement were reviewed in detail with the patient. Please refer to After Visit Summary for other counseling recommendations.   No follow-ups on file.  Future Appointments  Date Time Provider Department Center  04/25/2023 12:00 AM [facility] SCHED ROOM [facility] None    Randa Evens Autry-Lott, DO

## 2023-04-25 ENCOUNTER — Encounter (HOSPITAL_COMMUNITY): Payer: Self-pay | Admitting: Obstetrics and Gynecology

## 2023-04-25 ENCOUNTER — Inpatient Hospital Stay (HOSPITAL_COMMUNITY): Payer: Medicaid Other

## 2023-04-25 ENCOUNTER — Inpatient Hospital Stay (HOSPITAL_COMMUNITY)
Admission: RE | Admit: 2023-04-25 | Discharge: 2023-04-29 | DRG: 768 | Disposition: A | Discharge status: Home / Self Care | Payer: Medicaid Other | Attending: Obstetrics and Gynecology | Admitting: Obstetrics and Gynecology

## 2023-04-25 DIAGNOSIS — O24425 Gestational diabetes mellitus in childbirth, controlled by oral hypoglycemic drugs: Principal | ICD-10-CM | POA: Diagnosis present

## 2023-04-25 DIAGNOSIS — Z6791 Unspecified blood type, Rh negative: Secondary | ICD-10-CM | POA: Diagnosis not present

## 2023-04-25 DIAGNOSIS — Z3A37 37 weeks gestation of pregnancy: Secondary | ICD-10-CM

## 2023-04-25 DIAGNOSIS — Z7982 Long term (current) use of aspirin: Secondary | ICD-10-CM | POA: Diagnosis not present

## 2023-04-25 DIAGNOSIS — O411230 Chorioamnionitis, third trimester, not applicable or unspecified: Secondary | ICD-10-CM | POA: Diagnosis not present

## 2023-04-25 DIAGNOSIS — O26893 Other specified pregnancy related conditions, third trimester: Secondary | ICD-10-CM

## 2023-04-25 DIAGNOSIS — O24424 Gestational diabetes mellitus in childbirth, insulin controlled: Secondary | ICD-10-CM | POA: Diagnosis not present

## 2023-04-25 DIAGNOSIS — O24419 Gestational diabetes mellitus in pregnancy, unspecified control: Principal | ICD-10-CM

## 2023-04-25 HISTORY — DX: Gestational diabetes mellitus in pregnancy, unspecified control: O24.419

## 2023-04-25 LAB — CBC
HCT: 38.1 % (ref 36.0–46.0)
Hemoglobin: 12.7 g/dL (ref 12.0–15.0)
MCH: 29.5 pg (ref 26.0–34.0)
MCHC: 33.3 g/dL (ref 30.0–36.0)
MCV: 88.6 fL (ref 80.0–100.0)
Platelets: 267 10*3/uL (ref 150–400)
RBC: 4.3 MIL/uL (ref 3.87–5.11)
RDW: 13.6 % (ref 11.5–15.5)
WBC: 10.3 10*3/uL (ref 4.0–10.5)
nRBC: 0 % (ref 0.0–0.2)

## 2023-04-25 LAB — GLUCOSE, CAPILLARY
Glucose-Capillary: 125 mg/dL — ABNORMAL HIGH (ref 70–99)
Glucose-Capillary: 145 mg/dL — ABNORMAL HIGH (ref 70–99)
Glucose-Capillary: 97 mg/dL (ref 70–99)
Glucose-Capillary: 140 mg/dL — ABNORMAL HIGH (ref 70–99)
Glucose-Capillary: 73 mg/dL (ref 70–99)

## 2023-04-25 LAB — RPR: RPR Ser Ql: NONREACTIVE

## 2023-04-25 MED ORDER — MISOPROSTOL 50MCG HALF TABLET
50.0000 ug | ORAL_TABLET | Freq: Once | ORAL | Status: AC
  Administered 2023-04-25: 50 ug via ORAL
  Filled 2023-04-25: qty 1

## 2023-04-25 MED ORDER — OXYTOCIN BOLUS FROM INFUSION: 333.0000 mL | Freq: Once | INTRAVENOUS | Status: DC

## 2023-04-25 MED ORDER — OXYTOCIN-SODIUM CHLORIDE 30-0.9 UT/500ML-% IV SOLN
2.5000 [IU]/h | INTRAVENOUS | Status: DC
  Filled 2023-04-25: qty 500

## 2023-04-25 MED ORDER — ACETAMINOPHEN 325 MG PO TABS: 650.0000 mg | ORAL_TABLET | ORAL | Status: DC | PRN

## 2023-04-25 MED ORDER — LIDOCAINE HCL (PF) 1 % IJ SOLN
30.0000 mL | INTRAMUSCULAR | Status: AC | PRN
  Administered 2023-04-27: 30 mL via SUBCUTANEOUS
  Filled 2023-04-25: qty 30

## 2023-04-25 MED ORDER — DEXTROSE IN LACTATED RINGERS 5 % IV SOLN: INTRAVENOUS | Status: DC

## 2023-04-25 MED ORDER — MISOPROSTOL 50MCG HALF TABLET
50.0000 ug | ORAL_TABLET | ORAL | Status: DC
  Administered 2023-04-25: 50 ug via ORAL
  Filled 2023-04-25: qty 1

## 2023-04-25 MED ORDER — OXYCODONE-ACETAMINOPHEN 5-325 MG PO TABS: 2.0000 | ORAL_TABLET | ORAL | Status: DC | PRN

## 2023-04-25 MED ORDER — TERBUTALINE SULFATE 1 MG/ML IJ SOLN: 0.2500 mg | Freq: Once | INTRAMUSCULAR | Status: DC | PRN

## 2023-04-25 MED ORDER — OXYTOCIN-SODIUM CHLORIDE 30-0.9 UT/500ML-% IV SOLN
1.0000 m[IU]/min | INTRAVENOUS | Status: DC
  Administered 2023-04-25: 2 m[IU]/min via INTRAVENOUS
  Administered 2023-04-27: 30 m[IU]/min via INTRAVENOUS
  Filled 2023-04-25: qty 500

## 2023-04-25 MED ORDER — MISOPROSTOL 25 MCG QUARTER TABLET
25.0000 ug | ORAL_TABLET | Freq: Once | ORAL | Status: AC
  Administered 2023-04-25: 25 ug via VAGINAL
  Filled 2023-04-25: qty 1

## 2023-04-25 MED ORDER — DEXTROSE 50 % IV SOLN: 0.0000 mL | INTRAVENOUS | Status: DC | PRN

## 2023-04-25 MED ORDER — LACTATED RINGERS IV SOLN
500.0000 mL | INTRAVENOUS | Status: DC | PRN
  Administered 2023-04-26: 500 mL via INTRAVENOUS

## 2023-04-25 MED ORDER — LACTATED RINGERS IV SOLN: INTRAVENOUS | Status: DC

## 2023-04-25 MED ORDER — SOD CITRATE-CITRIC ACID 500-334 MG/5ML PO SOLN
30.0000 mL | ORAL | Status: DC | PRN
  Administered 2023-04-25 (×2): 30 mL via ORAL
  Filled 2023-04-25 (×2): qty 30

## 2023-04-25 MED ORDER — INSULIN REGULAR(HUMAN) IN NACL 100-0.9 UT/100ML-% IV SOLN: INTRAVENOUS | Status: DC

## 2023-04-25 MED ORDER — ONDANSETRON HCL 4 MG/2ML IJ SOLN
4.0000 mg | Freq: Four times a day (QID) | INTRAMUSCULAR | Status: DC | PRN
  Administered 2023-04-27: 4 mg via INTRAVENOUS
  Filled 2023-04-25: qty 2

## 2023-04-25 MED ORDER — METFORMIN HCL 500 MG PO TABS
1000.0000 mg | ORAL_TABLET | Freq: Two times a day (BID) | ORAL | Status: DC
  Administered 2023-04-25 (×2): 1000 mg via ORAL
  Filled 2023-04-25 (×4): qty 2

## 2023-04-25 MED ORDER — OXYCODONE-ACETAMINOPHEN 5-325 MG PO TABS: 1.0000 | ORAL_TABLET | ORAL | Status: DC | PRN

## 2023-04-25 MED ORDER — FENTANYL CITRATE (PF) 100 MCG/2ML IJ SOLN
100.0000 ug | INTRAMUSCULAR | Status: DC | PRN
  Administered 2023-04-25 – 2023-04-26 (×2): 100 ug via INTRAVENOUS
  Filled 2023-04-25 (×2): qty 2

## 2023-04-25 NOTE — Progress Notes (Signed)
LABOR PROGRESS NOTE  Dawn Kennedy is a 32 y.o. G2P0010 at [redacted]w[redacted]d  admitted for IOL 2/2 A2GDM.  Subjective: Feeling ctx more frequently. Still walking around and mobile.  Objective: BP 122/78   Pulse 68   Temp 97.7 F (36.5 C) (Oral)   Resp 16   Ht 5\' 1"  (1.549 m)   Wt 81.2 kg   LMP 08/07/2022   BMI 33.82 kg/m  or  Vitals:   04/25/23 1101 04/25/23 1201 04/25/23 1505 04/25/23 1609  BP: 109/80 111/71 112/70 122/78  Pulse: 75 74 75 68  Resp: 16 16 16    Temp: 97.6 F (36.4 C)  97.7 F (36.5 C)   TempSrc: Oral  Oral   Weight:      Height:       Dilation: 2 Effacement (%): 50 Station: -3 Presentation: Vertex Exam by:: Dr. Earlene Plater FHT: baseline rate 140, moderate varibility, acel, no decel Toco: Irregular  Labs: Lab Results  Component Value Date   WBC 10.3 04/25/2023   HGB 12.7 04/25/2023   HCT 38.1 04/25/2023   MCV 88.6 04/25/2023   PLT 267 04/25/2023    Patient Active Problem List   Diagnosis Date Noted   GDM, class A2 04/25/2023   Rib pain 04/08/2023   Gestational diabetes mellitus 02/18/2023   Rh negative status during pregnancy in third trimester 02/09/2023   Pregnancy 09/09/2022   Latent tuberculosis by blood test 12/17/2020    Assessment / Plan: 32 y.o. G2P0010 at [redacted]w[redacted]d here for IOL for A2GDM.   Labor: Minimal change. Additional cytotec 50 mg PO/25 mg vaginally. Patient extremely uncomfortable with cervical exams. Will need to consider pretreatment with fentanyl if needing balloon. Fetal Wellbeing: Cat I tracing Pain Control:  Considering epidural Anticipated MOD:  Vaginal  Wylene Simmer, MD OB Fellow  04/25/2023, 6:01 PM

## 2023-04-25 NOTE — Progress Notes (Signed)
LABOR PROGRESS NOTE  Dawn Kennedy is a 32 y.o. G2P0010 at [redacted]w[redacted]d admitted for IOL 2/2 A2GDM.  Subjective: Feeling ctx infrequently.  Objective: BP 111/71   Pulse 74   Resp 16   Ht 5\' 1"  (1.549 m)   Wt 81.2 kg   LMP 08/07/2022   BMI 33.82 kg/m  or  Vitals:   04/25/23 0926 04/25/23 1003 04/25/23 1101 04/25/23 1201  BP: 108/67 102/63 109/80 111/71  Pulse: 74 77 75 74  Resp:    16  Weight:      Height:       Dilation: 1.5 Effacement (%): Thick Station: Ballotable Presentation: Vertex (confirmed via [place]) Exam by:: Dr. Earlene Plater FHT: baseline rate 140, moderate varibility, + acel Toco: irregular  Labs: Lab Results  Component Value Date   WBC 10.3 04/25/2023   HGB 12.7 04/25/2023   HCT 38.1 04/25/2023   MCV 88.6 04/25/2023   PLT 267 04/25/2023    Patient Active Problem List   Diagnosis Date Noted   GDM, class A2 04/25/2023   Rib pain 04/08/2023   Gestational diabetes mellitus 02/18/2023   Rh negative status during pregnancy in third trimester 02/09/2023   Pregnancy 09/09/2022   Latent tuberculosis by blood test 12/17/2020    Assessment / Plan: 32 y.o. G2P0010 at [redacted]w[redacted]d here for IOL for A2GDM.  Labor: Minimal change. Additional cytotec 50 mg PO. Patient extremely uncomfortable with cervical exams. Will need to consider pretreatment with fentanyl if needing balloon. Fetal Wellbeing: Cat I tracing Pain Control:  Considering epidural Anticipated MOD:  Vaginal  Wylene Simmer, MD OB Fellow  04/25/2023, 2:33 PM

## 2023-04-25 NOTE — Progress Notes (Signed)
Labor Progress Note Dawn Kennedy is a 32 y.o. G2P0010 at [redacted]w[redacted]d presented for IOL 2/2 A2GDM.   S: No acute concerns.   O:  BP 116/74   Pulse 71   Temp 97.7 F (36.5 C) (Oral)   Resp 17   Ht 5\' 1"  (1.549 m)   Wt 81.2 kg   LMP 08/07/2022   BMI 33.82 kg/m  EFM: 140bpm/moderate/+accels, no decels  CVE: Dilation: 2 Effacement (%): 50 Station: -3 Presentation: Vertex Exam by:: Dr. Earlene Plater   A&P: 32 y.o. G2P0010 [redacted]w[redacted]d here for IOL 2/2 A2GDM.  #Labor: FB placed. Pit started, cap at 6 until FB out.  #Pain: IV fentanyl #FWB: Cat I  #GBS negative  A2GDM CBG 73 -Continue to monitor  [name], DO 11:19 PM '

## 2023-04-25 NOTE — H&P (Signed)
Dawn Kennedy is a 32 y.o. G2P0010 female at [redacted]w[redacted]d by LMP c/w 22wk u/s presenting IOL due to A2GDM.   Reports active fetal movement, contractions: none, vaginal bleeding: none, membranes: intact.  Initiated prenatal care at [facility] at 13.5wks wks w tx to [facility] at 35wks.   Most recent u/s : [redacted]w[redacted]d, EFW 96% (7+8), AFI 11cm, cephalic, post placenta.   This pregnancy complicated by: # A2GDM (Metformin 1gm bid) # Rh neg  Prenatal History/Complications:  # primigravida  Past Medical History: Past Medical History:  Diagnosis Date   Constipated    Encounter for health examination of refugee 12/17/2020   Patellofemoral syndrome of right knee 03/07/2021   Patellofemoral syndrome of right knee 03/07/2021   Seborrheic dermatitis 03/07/2021   Well woman exam (no gynecological exam) 11/22/2021    Past Surgical History: Past Surgical History:  Procedure Laterality Date   NO PAST SURGERIES      Obstetrical History: OB History     Gravida  2   Para      Term      Preterm      AB  1   Living         SAB  1   IAB      Ectopic      Multiple      Live Births              Social History: Social History   Socioeconomic History   Marital status: Married    Spouse name: Not on file   Number of children: Not on file   Years of education: Not on file   Highest education level: Not on file  Occupational History   Not on file  Tobacco Use   Smoking status: Never   Smokeless tobacco: Never  Vaping Use   Vaping status: Never Used  Substance and Sexual Activity   Alcohol use: Never   Drug use: Never   Sexual activity: Yes  Other Topics Concern   Not on file  Social History Narrative   Not on file   Social Determinants of Health   Financial Resource Strain: Not on file  Food Insecurity: No Food Insecurity (02/25/2023)   Hunger Vital Sign    Worried About Running Out of Food in the Last Year: Never true    Ran Out of Food in the Last Year: Never true  Transportation  Needs: Not on file  Physical Activity: Not on file  Stress: Not on file  Social Connections: Not on file    Family History: History reviewed. No pertinent family history.  Allergies: No Known Allergies  Medications Prior to Admission  Medication Sig Dispense Refill Last Dose   aspirin EC 81 MG tablet Take 1 tablet (81 mg total) by mouth daily. Swallow whole. 30 tablet 12 Past Week   metFORMIN (GLUCOPHAGE) 500 MG tablet Take 2 tablets (1,000 mg total) by mouth 2 (two) times daily with a meal. 60 tablet 5 04/24/2023   Prenatal Vit-Fe Fumarate-FA (PRENATAL VITAMIN) 27-0.8 MG TABS Take 1 tablet by mouth daily. 30 tablet 5 Past Week   Accu-Chek Softclix Lancets lancets Use as instructed 100 each 12    Blood Glucose Monitoring Suppl (ACCU-CHEK GUIDE ME) w/Device KIT Check blood sugar in the morning and 2 hours after meals 1 kit 0    cyclobenzaprine (FLEXERIL) 5 MG tablet Take 1 tablet (5 mg total) by mouth 3 (three) times daily as needed for muscle spasms (Take for severe breakthrough pain). 8  tablet 0    glucose blood test strip Use as instructed 100 each 12     Review of Systems  Pertinent pos/neg as indicated in HPI  Height 5\' 1"  (1.549 m), weight 81.2 kg, last menstrual period 08/07/2022, unknown if currently breastfeeding. General appearance: cooperative and no distress Lungs: clear to auscultation bilaterally Heart: regular rate and rhythm Abdomen: gravid, soft, non-tender, EFW by Leopold's approximately 8lbs Extremities: 1+ edema  Fetal monitoring: FHR: 145-150s bpm, variability: moderate,  Accelerations: Present,  decelerations:  Absent Uterine activity: None Dilation: 1.5 Effacement (%): Thick Station: Ballotable Exam by:: K.Louis-Charles,RN Presentation: cephalic   Prenatal labs: ABO, Rh: --/--/AB NEG (08/03 0345) Antibody: POS (08/03 0345) Rubella: 2.32 (12/19 1350) RPR: Non Reactive (05/23 0957)  HBsAg: Negative (12/19 1350)  HIV: Non Reactive (05/23 0957)  GBS:  Negative/-- (07/18 0452)  3hr GTT: 112/255/224/207  Prenatal Transfer Tool  Maternal Diabetes: Yes:  Diabetes Type:  Insulin/Medication controlled Genetic Screening: Declined Maternal Ultrasounds/Referrals: Normal Fetal Ultrasounds or other Referrals:  None Maternal Substance Abuse:  No Significant Maternal Medications:  Meds include: Other:  Metformin 1gm bid Significant Maternal Lab Results: Group B Strep negative and Rh negative  Results for orders placed or performed during the hospital encounter of 04/25/23 (from the past 24 hour(s))  CBC   Collection Time: 04/25/23  3:45 AM  Result Value Ref Range   WBC 10.3 4.0 - 10.5 K/uL   RBC 4.30 3.87 - 5.11 MIL/uL   Hemoglobin 12.7 12.0 - 15.0 g/dL   HCT 66.4 40.3 - 47.4 %   MCV 88.6 80.0 - 100.0 fL   MCH 29.5 26.0 - 34.0 pg   MCHC 33.3 30.0 - 36.0 g/dL   RDW 25.9 56.3 - 87.5 %   Platelets 267 150 - 400 K/uL   nRBC 0.0 0.0 - 0.2 %  Type and screen   Collection Time: 04/25/23  3:45 AM  Result Value Ref Range   ABO/RH(D) AB NEG    Antibody Screen POS    Sample Expiration      04/28/2023,2359 Performed at [facility], [address], [place], [place] [address]   Glucose, capillary   Collection Time: 04/25/23  4:28 AM  Result Value Ref Range   Glucose-Capillary 125 (H) 70 - 99 mg/dL     Assessment:  [redacted]w[redacted]d SIUP  G2P0010  A2GDM  Cat 1 FHR  GBS Negative/-- (07/18 0452)  Plan:  Admit to L&D  IV pain meds/epidural prn active labor  Plan cervical ripening with cytotec/cervical foley to start, followed by Pit/AROM prn  Anticipate vag delivery   Plans to breastfeed  Contraception: unsure    Arabella Merles CNM 04/25/2023, 5:15 AM

## 2023-04-26 ENCOUNTER — Inpatient Hospital Stay (HOSPITAL_COMMUNITY): Payer: Medicaid Other | Admitting: Anesthesiology

## 2023-04-26 LAB — GLUCOSE, CAPILLARY
Glucose-Capillary: 65 mg/dL — ABNORMAL LOW (ref 70–99)
Glucose-Capillary: 81 mg/dL (ref 70–99)
Glucose-Capillary: 84 mg/dL (ref 70–99)
Glucose-Capillary: 85 mg/dL (ref 70–99)
Glucose-Capillary: 90 mg/dL (ref 70–99)
Glucose-Capillary: 96 mg/dL (ref 70–99)

## 2023-04-26 MED ORDER — EPHEDRINE 5 MG/ML INJ: 10.0000 mg | INTRAVENOUS | Status: DC | PRN

## 2023-04-26 MED ORDER — LACTATED RINGERS IV SOLN: 500.0000 mL | Freq: Once | INTRAVENOUS | Status: DC

## 2023-04-26 MED ORDER — PHENYLEPHRINE 80 MCG/ML (10ML) SYRINGE FOR IV PUSH (FOR BLOOD PRESSURE SUPPORT): 80.0000 ug | PREFILLED_SYRINGE | INTRAVENOUS | Status: DC | PRN

## 2023-04-26 MED ORDER — FENTANYL-BUPIVACAINE-NACL 0.5-0.125-0.9 MG/250ML-% EP SOLN
12.0000 mL/h | EPIDURAL | Status: DC | PRN
  Administered 2023-04-26: 12 mL/h via EPIDURAL
  Filled 2023-04-26: qty 250

## 2023-04-26 MED ORDER — LIDOCAINE HCL (PF) 1 % IJ SOLN
INTRAMUSCULAR | Status: DC | PRN
  Administered 2023-04-26 (×2): 4 mL via EPIDURAL

## 2023-04-26 MED ORDER — DIPHENHYDRAMINE HCL 50 MG/ML IJ SOLN: 12.5000 mg | INTRAMUSCULAR | Status: DC | PRN

## 2023-04-26 NOTE — Anesthesia Preprocedure Evaluation (Signed)
Anesthesia Evaluation  Patient identified by MRN, date of birth, ID band Patient awake    Reviewed: Allergy & Precautions, Patient's Chart, lab work & pertinent test results  History of Anesthesia Complications Negative for: history of anesthetic complications  Airway Mallampati: II  TM Distance: >3 FB Neck ROM: Full    Dental no notable dental hx.    Pulmonary neg pulmonary ROS   Pulmonary exam normal        Cardiovascular negative cardio ROS Normal cardiovascular exam     Neuro/Psych negative neurological ROS  negative psych ROS   GI/Hepatic negative GI ROS, Neg liver ROS,,,  Endo/Other  diabetes, Gestational    Renal/GU negative Renal ROS  negative genitourinary   Musculoskeletal negative musculoskeletal ROS (+)    Abdominal   Peds  Hematology negative hematology ROS (+)   Anesthesia Other Findings Day of surgery medications reviewed with patient.  Reproductive/Obstetrics (+) Pregnancy                             Anesthesia Physical Anesthesia Plan  ASA: 2  Anesthesia Plan: Epidural   Post-op Pain Management:    Induction:   PONV Risk Score and Plan: Treatment may vary due to age or medical condition  Airway Management Planned: Natural Airway  Additional Equipment: Fetal Monitoring  Intra-op Plan:   Post-operative Plan:   Informed Consent: I have reviewed the patients History and Physical, chart, labs and discussed the procedure including the risks, benefits and alternatives for the proposed anesthesia with the patient or authorized representative who has indicated his/her understanding and acceptance.     Interpreter used for [employer] Discussed with:   Anesthesia Plan Comments:        Anesthesia Quick Evaluation

## 2023-04-26 NOTE — Progress Notes (Signed)
Labor Progress Note Payeton Germani is a 32 y.o. G2P0010 at [redacted]w[redacted]d presented for IOL 2/2 A2GDM S:   Denies any complaints  O:  BP 133/82   Pulse (!) 59   Temp 97.6 F (36.4 C) (Oral)   Resp 14   Ht 5\' 1"  (1.549 m)   Wt 81.2 kg   LMP 08/07/2022   BMI 33.82 kg/m  EFM: 140/mod/+a/-d  CVE: Dilation: 5.5 Effacement (%): 50 Station: -2 Presentation: Vertex Exam by:: Dr. Lucianne Muss   A&P: 32 y.o. G2P0010 [redacted]w[redacted]d here for IOL iso A2GDM #Labor: Progressing well. AROM performed by Dr. Camelia Phenes - moderate amount of clear fluid  cont titration of pitocin for labor augmentation. #Pain: IV pain meds #FWB: cat I strip #GBS negative #A2GDM: Cont to monitor BG  [name], MD 1:13 PM

## 2023-04-26 NOTE — Anesthesia Procedure Notes (Signed)
Epidural Patient location during procedure: OB Start time: 04/26/2023 2:49 PM End time: 04/26/2023 2:52 PM  Staffing Anesthesiologist: Kaylyn Layer, MD Performed: anesthesiologist   Preanesthetic Checklist Completed: patient identified, IV checked, risks and benefits discussed, monitors and equipment checked, pre-op evaluation and timeout performed  Epidural Patient position: sitting Prep: DuraPrep and site prepped and draped Patient monitoring: continuous pulse ox, blood pressure and heart rate Approach: midline Location: L3-L4 Injection technique: LOR air  Needle:  Needle type: Tuohy  Needle gauge: 17 G Needle length: 9 cm Needle insertion depth: 4 cm Catheter type: closed end flexible Catheter size: 19 Gauge Catheter at skin depth: 9 cm Test dose: negative and Other (1% lidocaine)  Assessment Events: blood not aspirated, no cerebrospinal fluid, injection not painful, no injection resistance, no paresthesia and negative IV test  Additional Notes Patient identified. Risks, benefits, and alternatives discussed with patient including but not limited to bleeding, infection, nerve damage, paralysis, failed block, incomplete pain control, headache, blood pressure changes, nausea, vomiting, reactions to medication, itching, and postpartum back pain. Confirmed with bedside nurse the patient's most recent platelet count. Confirmed with patient that they are not currently taking any anticoagulation, have any bleeding history, or any family history of bleeding disorders. Patient expressed understanding and wished to proceed. All questions were answered. Sterile technique was used throughout the entire procedure. Please see nursing notes for vital signs.   Crisp LOR on first pass. Test dose was given through epidural catheter and negative prior to continuing to dose epidural or start infusion. Warning signs of high block given to the patient including shortness of breath, tingling/numbness  in hands, complete motor block, or any concerning symptoms with instructions to call for help. Patient was given instructions on fall risk and not to get out of bed. All questions and concerns addressed with instructions to call with any issues or inadequate analgesia.  Reason for block:procedure for pain

## 2023-04-26 NOTE — Progress Notes (Addendum)
Labor Progress Note Dawn Kennedy is a 32 y.o. G2P0010 at [redacted]w[redacted]d presented for IOL 2/2 A2GDM S:   Denies any complaints. More comfortable after epidural. FOB, interpreter, and doula at bedside.   O:  BP 113/77   Pulse 68   Temp 97.6 F (36.4 C) (Oral)   Resp 14   Ht 5\' 1"  (1.549 m)   Wt 81.2 kg   LMP 08/07/2022   BMI 33.82 kg/m  EFM: 135/mod/-a/occ variable decels  CVE: Dilation: 5.5 Effacement (%): 50 Station: -1 Presentation: Vertex Exam by:: Dr. Camelia Phenes & Dr. Lucianne Muss   A&P: 32 y.o. G2P0010 [redacted]w[redacted]d here for IOL iso A2GDM #Labor: IUPC placed to better assess if pt is in adequate labor  #Pain: Epidural in place #FWB: cat II strip #GBS negative #A2GDM: Cont to monitor BG  [name], MD 5:34 PM

## 2023-04-26 NOTE — Progress Notes (Signed)
Labor Progress Note Dawn Kennedy is a 32 y.o. G2P0010 at [redacted]w[redacted]d presented for IOL 2/2 A2GDM S:  Called to bedside given ctx appear to be spacing out/less intense. Pt denies any complaints.  O:  BP 112/72   Pulse (!) 59   Temp 97.6 F (36.4 C) (Oral)   Resp 14   Ht 5\' 1"  (1.549 m)   Wt 81.2 kg   LMP 08/07/2022   BMI 33.82 kg/m  EFM: 145/mod/+a/occ variable decels  CVE: Dilation: 6 Effacement (%): 50 Station: -1 Presentation: Vertex Exam by:: Dr. Lucianne Muss   A&P: 32 y.o. G2P0010 [redacted]w[redacted]d here for IOL iso A2GDM #Labor: IUPC repositioned, will cont to uptitrate pitocin to adequate labor  #Pain: Epidural in place #FWB: cat II strip #GBS negative #A2GDM: Cont to monitor BG  [name], MD 6:57 PM

## 2023-04-26 NOTE — Progress Notes (Addendum)
Labor Progress Note  S: Ms. Dawn Kennedy is a 32 y.o. G2P0010 at [redacted]w[redacted]d  who presented for IOL 2/2 A2GDM on Metformin. Interpreter, [name], was used for conversation with pt.   She denies any acute complaints. She has noticed contractions, but has been able to walk around in hallways with her doula.   O: BP 124/82   Pulse 61   Temp 97.6 F (36.4 C) (Oral)   Resp 14   Ht 5\' 1"  (1.549 m)   Wt 81.2 kg   LMP 08/07/2022   BMI 33.82 kg/m   Cervical exam:  Dilation: 4 Effacement (%): 50 Station: -3 Presentation: Vertex Exam by:: K.Louis-Charles, RN   Fetal Monitoring: Baseline: 140 Variability: moderate Accelerations: present Decelerations: absent Contractions: q2-24min   A: SIUP at [redacted]w[redacted]d here for IOL 2/2 A2GDM.  # IOL: Continue titration of Pitocin  pt requesting AROM with next check   # Pain: IV pain meds  # FWB: Cat I strip  EFW 3410g (96%), AC>99% at [redacted]w[redacted]d  # GBS neg  # A2GDM: Cont to monitor BG  Metformin discontinued  [name], MD 04/26/2023 9:14 AM

## 2023-04-26 NOTE — Progress Notes (Signed)
Labor Progress Note Dawn Kennedy is a 32 y.o. G2P0010 at [redacted]w[redacted]d presented for IOL 2/2 A2GDM S:  Pt denies any complaints.  O:  BP 109/71   Pulse 65   Temp 98.1 F (36.7 C) (Oral)   Resp 16   Ht 5\' 1"  (1.549 m)   Wt 81.2 kg   LMP 08/07/2022   BMI 33.82 kg/m  EFM: 150/mod/+a/-d  CVE: Dilation: 7 Effacement (%): 80 Station: -1 Presentation: Vertex Exam by:: Dr. Lucianne Muss   A&P: 32 y.o. G2P0010 100w3d here for IOL iso A2GDM  #Labor: Continue pitocin, consider recheck in 2-3 hours, if no change, would consider pitocin washout #Pain: Epidural in place #FWB: cat I strip  fetal tachycardia noted - improving with bolus - if persistent, consider CBC to eval for chorio #GBS negative #A2GDM: Cont to monitor BG  [name], MD 8:02 PM

## 2023-04-26 NOTE — Progress Notes (Signed)
Labor Progress Note Casey Maxfield is a 32 y.o. G2P0010 at [redacted]w[redacted]d presented for IOL 2/2 A2GDM  S: No acute concerns.   O:  BP 99/68   Pulse (!) 103   Temp (!) 100.6 F (38.1 C) (Axillary)   Resp 16   Ht 5\' 1"  (1.549 m)   Wt 81.2 kg   LMP 08/07/2022   BMI 33.82 kg/m  EFM: 145bpm/moderate/+accels, decels  CVE: Dilation: 8.5 Effacement (%): 90 Station: -1, 0 Presentation: Vertex Exam by:: Dr Salvadore Dom  A&P: 32 y.o. G2P0010 [redacted]w[redacted]d here for IOL 2/2 A2GDM  #Labor: Making change. Continue pitocin. Temp 99 oral. Continue to monitor for symptoms of chorio #Pain: Epidural in place #FWB: Cat I  #GBS negative  #A2GDM CBG 90. Goal 90-120 -Continue to monitor  [name], DO 10:53 PM

## 2023-04-26 NOTE — Progress Notes (Signed)
Hypoglycemic Event  CBG: 65  Treatment: 8 oz juice/soda  Symptoms: None  Follow-up CBG: Time: 2156 CBG Result: 90  Possible Reasons for Event: Inadequate meal intake, labor  Comments/MD notified: n/a    [place] T Kema Santaella

## 2023-04-26 NOTE — Progress Notes (Signed)
Labor Progress Note Dawn Kennedy is a 32 y.o. G2P0010 at [redacted]w[redacted]d presented for IOL 2/2 A2GDM.   S: No acute concerns.   O:  BP 108/71   Pulse 68   Temp 97.8 F (36.6 C) (Oral)   Resp 17   Ht 5\' 1"  (1.549 m)   Wt 81.2 kg   LMP 08/07/2022   BMI 33.82 kg/m  EFM: 130bpm/moderate/+accels, no decels  CVE: Dilation: 4 Effacement (%): 50 Station: -3 Presentation: Vertex Exam by:: K.Louis-Charles, RN   A&P: 32 y.o. G2P0010 [redacted]w[redacted]d here for IOL 2/2 A2GDM.  #Labor: s/p FB. Continuing to titrate up on pitocin. Consider AROM at next exam.  #Pain: IV fentanyl #FWB: Cat I  #GBS negative  A2GDM CBG 96 -Continue to monitor  [name], DO 5:11 AM

## 2023-04-27 ENCOUNTER — Encounter (HOSPITAL_COMMUNITY): Payer: Self-pay | Admitting: Obstetrics and Gynecology

## 2023-04-27 DIAGNOSIS — O24424 Gestational diabetes mellitus in childbirth, insulin controlled: Secondary | ICD-10-CM

## 2023-04-27 DIAGNOSIS — Z3A37 37 weeks gestation of pregnancy: Secondary | ICD-10-CM

## 2023-04-27 DIAGNOSIS — O411230 Chorioamnionitis, third trimester, not applicable or unspecified: Secondary | ICD-10-CM

## 2023-04-27 LAB — GLUCOSE, CAPILLARY
Glucose-Capillary: 103 mg/dL — ABNORMAL HIGH (ref 70–99)
Glucose-Capillary: 111 mg/dL — ABNORMAL HIGH (ref 70–99)
Glucose-Capillary: 132 mg/dL — ABNORMAL HIGH (ref 70–99)
Glucose-Capillary: 196 mg/dL — ABNORMAL HIGH (ref 70–99)
Glucose-Capillary: 83 mg/dL (ref 70–99)
Glucose-Capillary: 89 mg/dL (ref 70–99)

## 2023-04-27 MED ORDER — WITCH HAZEL-GLYCERIN EX PADS: 1.0000 | MEDICATED_PAD | CUTANEOUS | Status: DC | PRN

## 2023-04-27 MED ORDER — GENTAMICIN SULFATE 40 MG/ML IJ SOLN
5.0000 mg/kg | INTRAVENOUS | Status: DC
  Administered 2023-04-27: 310 mg via INTRAVENOUS
  Filled 2023-04-27 (×2): qty 7.75

## 2023-04-27 MED ORDER — PRENATAL MULTIVITAMIN CH
1.0000 | ORAL_TABLET | Freq: Every day | ORAL | Status: DC
  Administered 2023-04-28 – 2023-04-29 (×2): 1 via ORAL
  Filled 2023-04-27 (×2): qty 1

## 2023-04-27 MED ORDER — ACETAMINOPHEN 500 MG PO TABS: 1000.0000 mg | ORAL_TABLET | Freq: Once | ORAL | Status: DC

## 2023-04-27 MED ORDER — ZOLPIDEM TARTRATE 5 MG PO TABS: 5.0000 mg | ORAL_TABLET | Freq: Every evening | ORAL | Status: DC | PRN

## 2023-04-27 MED ORDER — MISOPROSTOL 200 MCG PO TABS: 800.0000 ug | ORAL_TABLET | Freq: Once | ORAL | Status: AC

## 2023-04-27 MED ORDER — DIPHENHYDRAMINE HCL 25 MG PO CAPS: 25.0000 mg | ORAL_CAPSULE | Freq: Four times a day (QID) | ORAL | Status: DC | PRN

## 2023-04-27 MED ORDER — TRANEXAMIC ACID-NACL 1000-0.7 MG/100ML-% IV SOLN
INTRAVENOUS | Status: AC
  Administered 2023-04-27: 1000 mg
  Filled 2023-04-27: qty 100

## 2023-04-27 MED ORDER — TRANEXAMIC ACID-NACL 1000-0.7 MG/100ML-% IV SOLN: 1000.0000 mg | INTRAVENOUS | Status: DC

## 2023-04-27 MED ORDER — DIBUCAINE (PERIANAL) 1 % EX OINT: 1.0000 | TOPICAL_OINTMENT | CUTANEOUS | Status: DC | PRN

## 2023-04-27 MED ORDER — TETANUS-DIPHTH-ACELL PERTUSSIS 5-2.5-18.5 LF-MCG/0.5 IM SUSY: 0.5000 mL | PREFILLED_SYRINGE | Freq: Once | INTRAMUSCULAR | Status: DC

## 2023-04-27 MED ORDER — SIMETHICONE 80 MG PO CHEW: 80.0000 mg | CHEWABLE_TABLET | ORAL | Status: DC | PRN

## 2023-04-27 MED ORDER — IBUPROFEN 600 MG PO TABS
600.0000 mg | ORAL_TABLET | Freq: Four times a day (QID) | ORAL | Status: DC
  Administered 2023-04-27 – 2023-04-29 (×8): 600 mg via ORAL
  Filled 2023-04-27 (×9): qty 1

## 2023-04-27 MED ORDER — SODIUM CHLORIDE 0.9 % IV SOLN
2.0000 g | Freq: Four times a day (QID) | INTRAVENOUS | Status: DC
  Administered 2023-04-27: 2 g via INTRAVENOUS
  Filled 2023-04-27: qty 2000

## 2023-04-27 MED ORDER — SENNOSIDES-DOCUSATE SODIUM 8.6-50 MG PO TABS
2.0000 | ORAL_TABLET | Freq: Every day | ORAL | Status: DC
  Administered 2023-04-28 – 2023-04-29 (×2): 2 via ORAL
  Filled 2023-04-27 (×2): qty 2

## 2023-04-27 MED ORDER — ACETAMINOPHEN 325 MG PO TABS
650.0000 mg | ORAL_TABLET | ORAL | Status: DC | PRN
  Administered 2023-04-27: 650 mg via ORAL
  Filled 2023-04-27: qty 2

## 2023-04-27 MED ORDER — MISOPROSTOL 200 MCG PO TABS
ORAL_TABLET | ORAL | Status: AC
  Administered 2023-04-27: 800 ug via RECTAL
  Filled 2023-04-27: qty 4

## 2023-04-27 MED ORDER — ONDANSETRON HCL 4 MG PO TABS: 4.0000 mg | ORAL_TABLET | ORAL | Status: DC | PRN

## 2023-04-27 MED ORDER — POLYETHYLENE GLYCOL 3350 17 G PO PACK
17.0000 g | PACK | Freq: Every day | ORAL | Status: DC
  Administered 2023-04-27 – 2023-04-29 (×3): 17 g via ORAL
  Filled 2023-04-27 (×4): qty 1

## 2023-04-27 MED ORDER — ONDANSETRON HCL 4 MG/2ML IJ SOLN: 4.0000 mg | INTRAMUSCULAR | Status: DC | PRN

## 2023-04-27 MED ORDER — BENZOCAINE-MENTHOL 20-0.5 % EX AERO
1.0000 | INHALATION_SPRAY | CUTANEOUS | Status: DC | PRN
  Administered 2023-04-27: 1 via TOPICAL
  Filled 2023-04-27: qty 56

## 2023-04-27 MED ORDER — COCONUT OIL OIL: 1.0000 | TOPICAL_OIL | Status: DC | PRN

## 2023-04-27 NOTE — Discharge Summary (Signed)
Postpartum Discharge Summary  Date of Service updated***     Patient Name: Dawn Kennedy DOB: 1990-12-01 MRN: [id number]  Date of admission: 04/25/2023 Delivery date:04/27/2023 Delivering provider: [name] Date of discharge: 04/27/2023  Admitting diagnosis: GDM, class A2 [O24.419] Intrauterine pregnancy: [redacted]w[redacted]d     Secondary diagnosis:  Principal Problem:   GDM, class A2 Active Problems:   Rh negative status during pregnancy in third trimester   Obstetric vaginal laceration with type 3a third degree perineal laceration  Additional problems: ***    Discharge diagnosis: {DX.:23714}                                              Post partum procedures:{Postpartum procedures:23558} Augmentation: AROM and Pitocin Complications: None  Hospital course: Induction of Labor With Vaginal Delivery   32 y.o. yo G2P1011 at [redacted]w[redacted]d was admitted to the hospital 04/25/2023 for induction of labor.  Indication for induction: A2 DM.  Patient had an labor course complicated by protracted second stage.  Membrane Rupture Time/Date: 12:39 PM,04/26/2023  Delivery Method:Vaginal, Spontaneous Operative Delivery:Device used:Bell vacuum Indication: Maternal exhaustion Episiotomy: None Lacerations:  3rd degree;Perineal Details of delivery can be found in separate delivery note.  Patient had a postpartum course complicated by***. Patient is discharged home 04/27/23.  Newborn Data: Birth date:04/27/2023 Birth time:7:18 AM Gender:Female Living status:Living Apgars:7 ,9  Weight:3487 g  Magnesium Sulfate received: {Mag received:30440022} BMZ received: {BMZ received:30440023} Rhophylac:{Rhophylac received:30440032} RWE:{RXV:40086761} T-DaP:{Tdap:23962} Flu: {PJK:93267} Transfusion:{Transfusion received:30440034}  Physical exam  Vitals:   04/27/23 0942 04/27/23 1023 04/27/23 1148 04/27/23 1800  BP: 120/67 120/80 111/69 118/67  Pulse: 80 75 84 (!) 57  Resp:  18 16   Temp:  98.2 F (36.8 C)     TempSrc:  Oral    SpO2:  99% 97%   Weight:      Height:       General: {Exam; general:21111117} Lochia: {Desc; appropriate/inappropriate:30686::"appropriate"} Uterine Fundus: {Desc; firm/soft:30687} Incision: {Exam; incision:21111123} DVT Evaluation: {Exam; dvt:2111122} Labs: Lab Results  Component Value Date   WBC 10.3 04/25/2023   HGB 12.7 04/25/2023   HCT 38.1 04/25/2023   MCV 88.6 04/25/2023   PLT 267 04/25/2023      Latest Ref Rng & Units 12/18/2020    3:47 PM  CMP  Glucose 65 - 99 mg/dL 88   BUN 6 - 20 mg/dL 14   Creatinine 1.24 - 1.00 mg/dL 5.80   Sodium 998 - 338 mmol/L 140   Potassium 3.5 - 5.2 mmol/L 4.4   Chloride 96 - 106 mmol/L 102   CO2 20 - 29 mmol/L 23   Calcium 8.7 - 10.2 mg/dL 9.6   Total Protein 6.0 - 8.5 g/dL 7.9   Total Bilirubin 0.0 - 1.2 mg/dL 0.2   Alkaline Phos 44 - 121 IU/L 74   AST 0 - 40 IU/L 23   ALT 0 - 32 IU/L 18    Edinburgh Score:     No data to display           After visit meds:  Allergies as of 04/27/2023   No Known Allergies   Med Rec must be completed prior to using this [facility]***        Discharge home in stable condition Infant Feeding: {Baby feeding:23562} Infant Disposition:{CHL IP OB HOME WITH SNKNLZ:76734} Discharge instruction: per After Visit Summary and Postpartum booklet. Activity: Advance  as tolerated. Pelvic rest for 6 weeks.  Diet: {OB ZOXW:96045409} Future Appointments:No future appointments. Follow up Visit:  Message sent to [facility] by Autry-Lott on 04/27/2023  Please schedule this patient for a In person postpartum visit in 6 weeks with the following provider: MD. Additional Postpartum F/U:2 hour GTT in 6 weeks and perineal laceration check in 1 week  High risk pregnancy complicated by:  A2GDM, Rh neg, 3rd degree laceration  Delivery mode:  Vaginal, Spontaneous Anticipated Birth Control:  Unsure   04/27/2023 Randa Evens Autry-Lott, DO

## 2023-04-27 NOTE — Progress Notes (Signed)
Pharmacy Antibiotic Note  Dawn Kennedy is a 32 y.o. female admitted on 04/25/2023 with  Chorioamnionitis .  Pharmacy has been consulted for gentamicin dosing.  Plan: Ampicillin 2 gm IV q6h Gentamicin 5 mg/kg (310 mg) IV q24h  Height: 5\' 1"  (154.9 cm) Weight: 81.2 kg (179 lb) IBW/kg (Calculated) : 47.8  Temp (24hrs), Avg:99.1 F (37.3 C), Min:97.6 F (36.4 C), Max:100.6 F (38.1 C)  Recent Labs  Lab 04/25/23 0345  WBC 10.3    CrCl cannot be calculated (Patient's most recent lab result is older than the maximum 21 days allowed.).    No Known Allergies   Thank you for allowing pharmacy to be a part of this patient's care.  Vonzell Schlatter Dawn Kennedy 04/27/2023 4:12 AM

## 2023-04-27 NOTE — Anesthesia Postprocedure Evaluation (Signed)
Anesthesia Post Note  Patient: Dawn Kennedy  Procedure(s) Performed: AN AD HOC LABOR EPIDURAL     Patient location during evaluation: Mother Baby Anesthesia Type: Epidural Level of consciousness: awake and alert Pain management: pain level controlled Vital Signs Assessment: post-procedure vital signs reviewed and stable Respiratory status: spontaneous breathing, nonlabored ventilation and respiratory function stable Cardiovascular status: stable Postop Assessment: no headache, no backache and epidural receding Anesthetic complications: no   No notable events documented.  Last Vitals:  Vitals:   04/27/23 1023 04/27/23 1148  BP: 120/80 111/69  Pulse: 75 84  Resp: 18 16  Temp: 36.8 C   SpO2: 99% 97%    Last Pain:  Vitals:   04/27/23 1443  TempSrc:   PainSc: 2    Pain Goal:                   Dawn Kennedy

## 2023-04-27 NOTE — Lactation Note (Signed)
This note was copied from a baby's chart. Lactation Consultation Note  Patient Name: Dawn Kennedy [id number] Date: 04/27/2023 Age:32 hours  Reason for consult: Initial assessment;Primapara;1st time breastfeeding;Maternal endocrine disorder;Early term 37-38.6wks  P1, [redacted]w[redacted]d GA, early term infant, GDM-Metformin (poor control)   Mother speaks [place] and father was fluent with [place]. Mother states she has been breastfeeding and reports she tries but baby is often sleepy. Discussed the benefits of skin to skin, hand expression/ pumping to stimulate milk supply. Discussed the benefits of any amount of colostrum for the baby.   Mother had 4 ml of colostrum in a bottle that she had pumped within the last 4 hours. Advised mother to feed any EBM to baby. Mother was bottle feeding colostrum when I left the room.   Encouraged to latch baby with feeding cues, place baby skin to skin if not latching, and call for assistance with breastfeeding as needed. Anticipate as baby approaches 24 hours of age, baby will feed more often (8-12 plus times in 24 hours) at breast and "cluster feed".    Mom made aware of O/P services, breastfeeding support groups, and our phone # for post-discharge questions.     Maternal Data Has patient been taught Hand Expression?: Yes (by RN) Does the patient have breastfeeding experience prior to this delivery?: No  Feeding Mother's Current Feeding Choice: Breast Milk Nipple Type: Slow - flow  Lactation Tools Discussed/Used Tools: Pump;Flanges (pump started and educated by RN) Breast pump type: Double-Electric Breast Pump Pump Education: Setup, frequency, and cleaning;Milk Storage Reason for Pumping: stimulate milk production/ supplement Pumping frequency: pump every 3 hours until baby begins to breastfeed frequently Pumped volume: 4 mL  Interventions Interventions: Education;LC Services brochure     Consult Status Consult Status: Follow-up Date:  04/28/23 Follow-up type: In-patient    Christella Hartigan M 04/27/2023, 7:37 PM

## 2023-04-27 NOTE — Progress Notes (Signed)
Labor Progress Note Dawn Kennedy is a 32 y.o. G2P0010 at [redacted]w[redacted]d presented for IOL 2/2 A2GDM  S: No acute concerns.   O:  BP 114/80   Pulse 72   Temp 99 F (37.2 C) (Oral)   Resp 15   Ht 5\' 1"  (1.549 m)   Wt 81.2 kg   LMP 08/07/2022   BMI 33.82 kg/m  EFM: 130bpm/moderate/+accels, decels  CVE: Dilation: 10 Dilation Complete Date: 04/27/23 Dilation Complete Time: 0210 Effacement (%): 100 Station: Plus 1 Presentation: Vertex Exam by:: [place] Turner RN  A&P: 32 y.o. G2P0010 [redacted]w[redacted]d here for IOL 2/2 A2GDM  #Labor: Starting to push #Pain: Epidural in place #FWB: Cat I  #GBS negative  #A2GDM CBG 89. Goal 90-120 -Continue to monitor  [name], DO 3:28 AM

## 2023-04-28 LAB — GLUCOSE, CAPILLARY: Glucose-Capillary: 127 mg/dL — ABNORMAL HIGH (ref 70–99)

## 2023-04-28 NOTE — Progress Notes (Signed)
Pt fasting  blood sugar 127. Pt verbalized after the test that " I had  cake at 0600."

## 2023-04-28 NOTE — Progress Notes (Signed)
POSTPARTUM PROGRESS NOTE  Post Partum Day 1  Subjective:  Dawn Kennedy is a 32 y.o. G2P1011 s/p VD at [redacted]w[redacted]d.  She reports she is doing well. No acute events overnight. She denies any problems with ambulating, voiding or po intake. Denies nausea or vomiting.  Pain is moderately controlled.  Lochia is appropriate.  Objective: Blood pressure 116/75, pulse 76, temperature 97.6 F (36.4 C), temperature source Oral, resp. rate 17, height 5\' 1"  (1.549 m), weight 81.2 kg, last menstrual period 08/07/2022, SpO2 99%, unknown if currently breastfeeding.  Physical Exam:  General: alert, cooperative and no distress Chest: no respiratory distress Heart:regular rate, distal pulses intact Abdomen: soft, nontender,  Uterine Fundus: firm, appropriately tender DVT Evaluation: No calf swelling or tenderness Extremities: Mild LE edema Skin: warm, dry  Assessment/Plan: Dawn Kennedy is a 32 y.o. G2P1011 s/p VD at [redacted]w[redacted]d.  PPD#1 - Doing well  Routine postpartum care Contraception: Unsure Feeding: Breast Dispo: Plan for discharge 8/7.   LOS: 3 days   Dawn Jumbo, DO OB Fellow, [facility], [facility] 04/28/2023, 8:36 AM

## 2023-04-28 NOTE — Lactation Note (Signed)
This note was copied from a baby's chart. Lactation Consultation Note  Patient Name: Dawn Kennedy [name] Date: 04/28/2023 Age:32 hours, P1  Reason for consult: Follow-up assessment;Maternal endocrine disorder;Primapara;1st time breastfeeding;Early term 37-38.6wks According parents and the doc flow sheets baby last fed at 1225 and had not eaten since.  LC offered to assist , mom receptive, LC checked the diaper, changed a small to med loose med stool.  Baby more awake and assisted to latch on the right breast, football with depth and swallows. Per mom comfortable.  Baby was still feeding at 20 mins. LC reviewed breast feeding basics and the importance of feeding with feeding cues and by 3 hours if not awake to check the  Diaper and place the baby STS.  LC stressed the importance of calling on the nurses light for assistance to latch if needed. Latch score 8    Maternal Data Has patient been taught Hand Expression?: Yes Does the patient have breastfeeding experience prior to this delivery?: No  Feeding Mother's Current Feeding Choice: Breast Milk  LATCH Score Latch: Grasps breast easily, tongue down, lips flanged, rhythmical sucking.  Audible Swallowing: A few with stimulation  Type of Nipple: Everted at rest and after stimulation  Comfort (Breast/Nipple): Soft / non-tender  Hold (Positioning): Assistance needed to correctly position infant at breast and maintain latch.  LATCH Score: 8   Lactation Tools Discussed/Used    Interventions Interventions: Breast feeding basics reviewed  Discharge Pump: Personal (per mom double, not sure the kind)  Consult Status Consult Status: Follow-up Date: 04/29/23 Follow-up type: In-patient    Dawn Kennedy 04/28/2023, 4:08 PM

## 2023-04-29 ENCOUNTER — Other Ambulatory Visit (HOSPITAL_COMMUNITY): Payer: Self-pay

## 2023-04-29 LAB — TYPE AND SCREEN
ABO/RH(D): AB NEG
Antibody Screen: POSITIVE
Unit division: 0
Unit division: 0

## 2023-04-29 LAB — BPAM RBC
Blood Product Expiration Date: 202408192359
Blood Product Expiration Date: 202408202359
Unit Type and Rh: 600
Unit Type and Rh: 600

## 2023-04-29 LAB — GLUCOSE, CAPILLARY
Glucose-Capillary: 62 mg/dL — ABNORMAL LOW (ref 70–99)
Glucose-Capillary: 76 mg/dL (ref 70–99)

## 2023-04-29 MED ORDER — POLYETHYLENE GLYCOL 3350 17 GM/SCOOP PO POWD
17.0000 g | Freq: Every day | ORAL | 0 refills | Status: DC
  Filled 2023-04-29: qty 238, 14d supply, fill #0

## 2023-04-29 MED ORDER — BENZOCAINE-MENTHOL 20-0.5 % EX AERO
1.0000 | INHALATION_SPRAY | CUTANEOUS | 0 refills | Status: DC | PRN
  Filled 2023-04-29: qty 85, 30d supply, fill #0
  Filled 2023-04-29: qty 78, fill #0

## 2023-04-29 MED ORDER — IBUPROFEN 600 MG PO TABS
600.0000 mg | ORAL_TABLET | Freq: Four times a day (QID) | ORAL | 0 refills | Status: DC
  Filled 2023-04-29: qty 30, 8d supply, fill #0

## 2023-04-29 MED ORDER — SENNOSIDES-DOCUSATE SODIUM 8.6-50 MG PO TABS
2.0000 | ORAL_TABLET | Freq: Every day | ORAL | 0 refills | Status: DC
  Filled 2023-04-29: qty 30, 15d supply, fill #0

## 2023-04-29 MED ORDER — ACETAMINOPHEN 325 MG PO TABS
650.0000 mg | ORAL_TABLET | ORAL | 0 refills | Status: DC | PRN
  Filled 2023-04-29: qty 100, 9d supply, fill #0

## 2023-04-29 MED ORDER — PRENATAL MULTIVITAMIN CH
1.0000 | ORAL_TABLET | Freq: Every day | ORAL | 0 refills | Status: DC
  Filled 2023-04-29: qty 30, 30d supply, fill #0

## 2023-04-29 NOTE — Progress Notes (Signed)
Patient's fasting CBG 62, asymptomatic.  Patient's given 4oz of apple juice and a pack of graham cracker.  Patient's repeat CBG is 76.

## 2023-04-29 NOTE — Lactation Note (Addendum)
This note was copied from a baby's chart. Lactation Consultation Note  Patient Name: Dawn Kennedy [id number] Date: 04/29/2023 Age:32 hours  Reason for consult: Follow-up assessment;Primapara;1st time breastfeeding;Early term 37-38.6wks;Breastfeeding assistance;Infant weight loss  P1, [redacted]w[redacted]d, 6% weight loss, early term infant   Mother feels that breastfeeding is progressing well. Mother can express colostrum and can latch baby without assist. Baby was sleepy, awaken when removed from covers. Mother hand expressed colostrum prior to latch. Due to infant's sleepiness, baby Dawn "Helia" suckled briefly the breast and infant re-latched. Mom taught how to do alternate breast compression to increase milk transfer. Also assisted with position and depth on the breast. Mother is receptive and eager to learn.   Mother pumped to stimulate production and to feed her expressed milk to baby. Parents have also supplemented with formula. Bilirubin results are pending. If discharged or stays, recommended pumping after breastfeeding until her milk volume is in and baby actively feeding well. Supplement with formula, as needed if unable to express milk.  Feeding Mother's Current Feeding Choice: Breast Milk and Formula  LATCH Score Latch: Grasps breast easily, tongue down, lips flanged, rhythmical sucking.  Audible Swallowing: A few with stimulation  Type of Nipple: Everted at rest and after stimulation  Comfort (Breast/Nipple): Soft / non-tender  Hold (Positioning): Assistance needed to correctly position infant at breast and maintain latch.  LATCH Score: 8   Lactation Tools Discussed/Used Tools: Pump;Flanges Flange Size: 21 Breast pump type: Double-Electric Breast Pump Reason for Pumping: stimulate milk production/ supplement Pumping frequency: pump to supplement  Interventions Interventions: Breast feeding basics reviewed;Assisted with latch;Hand express;Adjust position;Support pillows;Hand  pump;Education  Discharge Discharge Education: Engorgement and breast care;Warning signs for feeding baby Pump: Personal  Consult Status Consult Status: Complete Date: 04/29/23    Omar Person 04/29/2023, 1:16 PM

## 2023-04-30 ENCOUNTER — Encounter: Payer: Self-pay | Admitting: Family Medicine

## 2023-05-05 ENCOUNTER — Ambulatory Visit: Payer: Medicaid Other | Admitting: Obstetrics and Gynecology

## 2023-05-05 ENCOUNTER — Other Ambulatory Visit: Payer: Self-pay

## 2023-05-08 ENCOUNTER — Encounter: Payer: Self-pay | Admitting: Student

## 2023-05-08 ENCOUNTER — Inpatient Hospital Stay (HOSPITAL_COMMUNITY)
Admission: AD | Admit: 2023-05-08 | Discharge: 2023-05-08 | Disposition: A | Discharge status: Home / Self Care | Payer: Medicaid Other | Attending: Obstetrics and Gynecology | Admitting: Obstetrics and Gynecology

## 2023-05-08 ENCOUNTER — Encounter (HOSPITAL_COMMUNITY): Payer: Self-pay | Admitting: Obstetrics and Gynecology

## 2023-05-08 ENCOUNTER — Ambulatory Visit: Payer: Medicaid Other | Admitting: Student

## 2023-05-08 VITALS — BP 120/81 | HR 83 | Wt 158.0 lb

## 2023-05-08 DIAGNOSIS — Z5189 Encounter for other specified aftercare: Secondary | ICD-10-CM

## 2023-05-08 DIAGNOSIS — Z4801 Encounter for change or removal of surgical wound dressing: Secondary | ICD-10-CM | POA: Diagnosis not present

## 2023-05-08 DIAGNOSIS — R58 Hemorrhage, not elsewhere classified: Secondary | ICD-10-CM

## 2023-05-08 DIAGNOSIS — R102 Pelvic and perineal pain: Secondary | ICD-10-CM | POA: Diagnosis present

## 2023-05-08 DIAGNOSIS — O9089 Other complications of the puerperium, not elsewhere classified: Secondary | ICD-10-CM | POA: Insufficient documentation

## 2023-05-08 LAB — POCT HEMOGLOBIN: Hemoglobin: 13.9 g/dL (ref 11–14.6)

## 2023-05-08 NOTE — MAU Provider Note (Signed)
History     CSN: [id number]  Arrival date and time: 05/08/23 1600   Event Date/Time   First Provider Initiated Contact with Patient 05/08/23 1713      Chief Complaint  Patient presents with   Vaginal Bleeding   Abdominal Pain   HPI Dawn Kennedy is a 32 y.o. year old G43P1011 female at 54 days PP s/p SVD with 3rd laceration who presents to MAU reporting she was seen at [facility] for wound check and was instructed to be seen here because they "saw a white string hanging out of her vagina and she might need an ultrasound." She reports she had heavier vaginal bleeding with some clots over the last 3 days, but none today. Now she soaks about 3/4 of a pad every 2-3 hours. She rates the vaginal pain a 7/10. She is unsure if she can tolerate an vaginal ultrasound, because she could not tolerate a vaginal exam at the office today. She receives [facility] with [facility]; next appt is 05/18/2023. Her spouse is present and contributing to the history taking.   **In-Person [employer] Interpreter, [name] T. Present and used for entire visit.**  OB History     Gravida  2   Para  1   Term  1   Preterm      AB  1   Living  1      SAB  1   IAB      Ectopic      Multiple  0   Live Births  1           Past Medical History:  Diagnosis Date   Constipated    Encounter for health examination of refugee 12/17/2020   Patellofemoral syndrome of right knee 03/07/2021   Patellofemoral syndrome of right knee 03/07/2021   Seborrheic dermatitis 03/07/2021   Well woman exam (no gynecological exam) 11/22/2021    Past Surgical History:  Procedure Laterality Date   NO PAST SURGERIES      History reviewed. No pertinent family history.  Social History   Tobacco Use   Smoking status: Never   Smokeless tobacco: Never  Vaping Use   Vaping status: Never Used  Substance Use Topics   Alcohol use: Never   Drug use: Never    Allergies: No Known Allergies  Medications Prior to Admission   Medication Sig Dispense Refill Last Dose   acetaminophen (TYLENOL) 325 MG tablet Take 2 tablets (650 mg total) by mouth every 4 (four) hours as needed (for pain scale < 4). 100 tablet 0 05/07/2023   benzocaine-Menthol (DERMOPLAST) 20-0.5 % AERO Apply 1 Application topically as needed for irritation (perineal discomfort). 85 g 0 05/07/2023   ibuprofen (ADVIL) 600 MG tablet Take 1 tablet (600 mg total) by mouth every 6 (six) hours. 30 tablet 0 05/07/2023   polyethylene glycol powder (GLYCOLAX/MIRALAX) 17 GM/SCOOP powder Take 17 g by mouth daily. 238 g 0 05/07/2023   Prenatal Vit-Fe Fumarate-FA (PRENATAL MULTIVITAMIN) TABS tablet Take 1 tablet by mouth daily at 12 noon. 30 tablet 0 05/07/2023   Prenatal Vit-Fe Fumarate-FA (PRENATAL VITAMIN) 27-0.8 MG TABS Take 1 tablet by mouth daily. 30 tablet 5 05/07/2023   senna-docusate (SENOKOT-S) 8.6-50 MG tablet Take 2 tablets by mouth daily. 30 tablet 0 05/07/2023    Review of Systems  Constitutional: Negative.   HENT: Negative.    Eyes: Negative.   Respiratory: Negative.    Cardiovascular: Negative.   Gastrointestinal: Negative.   Endocrine: Negative.   Genitourinary:  Positive for vaginal pain (white string material coming from vagina seen by last provider in the [facility]). Negative for vaginal bleeding (heavy with clots for 3 days, but none currently).  Musculoskeletal: Negative.   Skin: Negative.   Allergic/Immunologic: Negative.   Neurological: Negative.   Hematological: Negative.   Psychiatric/Behavioral: Negative.     Physical Exam   Blood pressure 119/86, pulse 71, temperature 98.6 F (37 C), temperature source Oral, resp. rate 16, height 5\' 1"  (1.549 m), weight 71 kg, SpO2 99%, currently breastfeeding.  Physical Exam Vitals and nursing note reviewed. Exam conducted with a chaperone present.  Constitutional:      Appearance: Normal appearance. She is normal weight.  Cardiovascular:     Rate and Rhythm: Normal rate.  Pulmonary:     Effort:  Pulmonary effort is normal.  Abdominal:     Palpations: Abdomen is soft.  Genitourinary:    General: Normal vulva.     Comments: 3rd repair edges well approximated, suture material visible, but intact. No active bleeding or signs of infection or dehiscence. Musculoskeletal:        General: Normal range of motion.  Skin:    General: Skin is warm and dry.  Neurological:     Mental Status: She is alert and oriented to person, place, and time.  Psychiatric:        Mood and Affect: Mood normal.        Behavior: Behavior normal.        Thought Content: Thought content normal.        Judgment: Judgment normal.    MAU Course  Procedures  MDM External Pelvic Exam  Assessment and Plan  1. Visit for wound check - Normal healing 3rd degree repair  2. Vaginal pain - Sitz bath given - Use of sitz bath demonstrated to patient - Advised to make padsicles with witch hazel and lavender essential oil - freeze in freezer  3. Postpartum hemorrhage, unspecified type - Advised no need seen at this time to order an ultrasound - Advised to f/u with provider if bleeding increases again and have them order ultrasound, if they decide it is necessary  - Discharge patient - Keep scheduled appt with [facility] on 05/18/2023 - Patient verbalized an understanding of the plan of care and agrees.   Raelyn Mora, CNM 05/08/2023, 5:14 PM

## 2023-05-08 NOTE — MAU Note (Addendum)
...  Dawn Kennedy is a 32 y.o. at 11 days post partum vaginal delivery here in MAU reporting: Was informed to come be evaluated per MD for retained products of conception. She reports prior to 2-3 days ago she was changing her pad every hour and was passing large clots. She reports over the past two days her bleeding has decreased and she is soaking 3/4 of a pad every two hours. She is also concerned about a "string hanging from my vagina." She reports she is still experiencing vaginal pain but reports it has improved since delivery. Patient had a third degree perineal laceration.  In person interpreter [name] T through [facility].  Pain score: 7/10 vaginal pain  Lab orders placed from triage:  none

## 2023-05-08 NOTE — Patient Instructions (Addendum)
It was great to see you today! Thank you for choosing [facility] for your primary care.  Today we addressed: Please go to the MAU  Schedule a follow up at 4 weeks   If you haven't already, sign up for My Chart to have easy access to your labs results, and communication with your primary care physician. I recommend that you always bring your medications to each appointment as this makes it easy to ensure you are on the correct medications and helps [place] not miss refills when you need them. Call the clinic at [phone] if your symptoms worsen or you have any concerns. Return in about 6 weeks (around 06/19/2023). Please arrive 15 minutes before your appointment to ensure smooth check in process.  We appreciate your efforts in making this happen.  Thank you for allowing me to participate in your care, Alfredo Martinez, MD 05/08/2023, 3:37 PM PGY-3, [facility]

## 2023-05-08 NOTE — Progress Notes (Signed)
  [facility] Postpartum Visit   Dawn Kennedy is a 32 y.o. G2P1011 presenting for a postpartum visit.  She has the following concerns today: Vaginal bleeding, abdominal pain She delivered via vaginal delivery at [redacted]w[redacted]d. She had a third degree perineal laceration.   She reports her vaginal bleeding is significant, changes a pad every 1-2 hours. She is breastfeeding her infant. She feels she is bonding well. Patient reports that she has "clots" from the vagina over last couple of days.   -  Vitals:   05/08/23 1510  BP: 120/81  Pulse: 83  SpO2: 100%   Pelvic exam: Vulvar irritation, small white material in introitus, difficult to assess secondary to patient pain level    A/P:  Postpartum visit: patient is 1 week postpartum following a SVD delivery. -Discussed patients delivery and complications -Patient had a 3rd degree laceration, perineal healing reviewed. Patient expressed understanding -Bleeding to that extent causes concern for retained products of conception, reassured with POC 13, but instructed patient that I felt she needed further evaluation in the MAU because of her bleeding.  -Patient verbalized understanding and reported that she would go to the hospital. -Risks of not being seen in MAU discussed.   -Need for postpartum diabetes screening: 2 hour glucose tolerance testing between 6-12 weeks postpartum -Reviewed prior Pap and she is next due for Pap in 3 years, 5 mo ago NILM.  -Return in Return in about 6 weeks (around 06/19/2023).

## 2023-05-18 ENCOUNTER — Ambulatory Visit: Payer: Medicaid Other | Admitting: Family Medicine

## 2023-05-18 ENCOUNTER — Encounter: Payer: Self-pay | Admitting: Advanced Practice Midwife

## 2023-05-18 ENCOUNTER — Ambulatory Visit (INDEPENDENT_AMBULATORY_CARE_PROVIDER_SITE_OTHER): Payer: Medicaid Other | Admitting: Advanced Practice Midwife

## 2023-05-18 ENCOUNTER — Other Ambulatory Visit: Payer: Self-pay

## 2023-05-18 VITALS — BP 118/82 | HR 75 | Wt 151.7 lb

## 2023-05-18 DIAGNOSIS — Z5189 Encounter for other specified aftercare: Secondary | ICD-10-CM

## 2023-05-18 DIAGNOSIS — R102 Pelvic and perineal pain: Secondary | ICD-10-CM | POA: Diagnosis not present

## 2023-05-18 NOTE — Progress Notes (Signed)
   GYNECOLOGY PROGRESS NOTE  History:  32 y.o. G2P1011 presents to [facility] office today for postpartum check of third degree laceration and repair.  She reports vaginal pain when standing 15-20 minutes.    She denies h/a, dizziness, shortness of breath, n/v, or fever/chills.    The following portions of the patient's history were reviewed and updated as appropriate: allergies, current medications, past family history, past medical history, past social history, past surgical history and problem list. Last pap smear on 11/24/22 was normal, negative HRHPV.  Health Maintenance Due  Topic Date Due   COVID-19 Vaccine (5 - 2023-24 season) 05/23/2022   INFLUENZA VACCINE  04/23/2023     Review of Systems:  Pertinent items are noted in HPI.   Objective:  Physical Exam Blood pressure 118/82, pulse 75, weight 151 lb 11.2 oz (68.8 kg), currently breastfeeding. VS reviewed, nursing note reviewed,  Constitutional: well developed, well nourished, no distress HEENT: normocephalic CV: normal rate Pulm/chest wall: normal effort Breast Exam: deferred Abdomen: soft Neuro: alert and oriented x 3 Skin: warm, dry Psych: affect normal Pelvic exam: On visual inspection and with gentle palpation to posterior vaginal wall, laceration/repair well approximated, without edema, erythema, or exudate.    Assessment & Plan:  1. Visit for wound check --Laceration healing well, no s/sx of infection, some visible suture at introitus --Sitz baths, warm water only or water with Epsom salt daily to BID --Offered pelvic floor PT, pt would like to try some exercises at home, pelvic floor exercises with pictures printed for patient in Farsi language. Pt to follow up at [facility] visit in 3 weeks, consider PT if needed.  2. Vaginal pain   3. Third degree laceration of perineum during delivery, postpartum    Return for As scheduled.   Sharen Counter, CNM 5:10 PM

## 2023-06-09 ENCOUNTER — Ambulatory Visit: Payer: Medicaid Other | Admitting: Family Medicine

## 2023-06-09 ENCOUNTER — Ambulatory Visit: Payer: Medicaid Other | Admitting: Obstetrics and Gynecology

## 2023-06-17 ENCOUNTER — Ambulatory Visit (INDEPENDENT_AMBULATORY_CARE_PROVIDER_SITE_OTHER): Payer: Medicaid Other | Admitting: Obstetrics and Gynecology

## 2023-06-17 ENCOUNTER — Other Ambulatory Visit (HOSPITAL_COMMUNITY)
Admission: RE | Admit: 2023-06-17 | Discharge: 2023-06-17 | Disposition: A | Discharge status: Home / Self Care | Payer: Medicaid Other | Source: Ambulatory Visit | Attending: Family Medicine | Admitting: Family Medicine

## 2023-06-17 ENCOUNTER — Other Ambulatory Visit: Payer: Self-pay

## 2023-06-17 VITALS — BP 104/70 | HR 78 | Wt 154.1 lb

## 2023-06-17 DIAGNOSIS — N898 Other specified noninflammatory disorders of vagina: Secondary | ICD-10-CM | POA: Insufficient documentation

## 2023-06-17 DIAGNOSIS — K5909 Other constipation: Secondary | ICD-10-CM

## 2023-06-17 MED ORDER — POLYETHYLENE GLYCOL 3350 17 GM/SCOOP PO POWD
17.0000 g | Freq: Every day | ORAL | 3 refills | Status: DC
Start: 2023-06-17 — End: 2024-04-14

## 2023-06-17 NOTE — Progress Notes (Signed)
Post Partum Visit Note  Dawn Kennedy is a 32 y.o. G55P1011 female who presents for a postpartum visit. She is  7.2  weeks postpartum following a vacuum-assisted vaginal delivery.  I have fully reviewed the prenatal and intrapartum course. The delivery was at 37.4 gestational weeks.  Anesthesia: epidural. Postpartum course has been good. Baby is doing well. Baby is feeding by breast. Bleeding no bleeding. Bowel function is abnormal: constipation . Bladder function is normal. Patient is sexually active. Contraception method is none. Postpartum depression screening: negative.   The pregnancy intention screening data noted above was reviewed. Potential methods of contraception were discussed. The patient elected to proceed with No data recorded.    Health Maintenance Due  Topic Date Due   INFLUENZA VACCINE  04/23/2023   COVID-19 Vaccine (5 - 2023-24 season) 05/24/2023    The following portions of the patient's history were reviewed and updated as appropriate: allergies, current medications, past family history, past medical history, past social history, past surgical history, and problem list.  Review of Systems Pertinent items are noted in HPI.  Objective:  BP 104/70   Pulse 78   Wt 154 lb 1.6 oz (69.9 kg)   LMP 08/07/2022   BMI 29.12 kg/m    General:  alert and cooperative   Breasts:  not indicated  Lungs: Normal respiratory effort  Wound N/a  GU exam:   Superficial separation of posterior fourchette, tender to palpation with healthy granulation tissue        Assessment:   1. Other constipation - polyethylene glycol powder (GLYCOLAX/MIRALAX) 17 GM/SCOOP powder; Take 17 g by mouth daily.  Dispense: 238 g; Refill: 3  2. Postpartum state Doing well  3. Third degree laceration of perineum during delivery, postpartum Healing, advised to wait at least another 2 weeks and allow for completely healing prior to attempting vaginal penetration again   Routine postpartum exam.    Plan:   Essential components of care per ACOG recommendations:  1.  Mood and well being: Patient with negative depression screening today. Reviewed local resources for support.  - Patient tobacco use? No.   - hx of drug use? No.    2. Infant care and feeding:  -Patient currently breastmilk feeding? Yes. Reviewed importance of draining breast regularly to support lactation.  -Social determinants of health (SDOH) reviewed in EPIC. No concerns  3. Sexuality, contraception and birth spacing - Patient does not want a pregnancy in the next year.  Desired family size is 2 children.  - Reviewed reproductive life planning. Reviewed contraceptive methods based on pt preferences and effectiveness.  Patient desired Withdrawal or Other Method today.   - Discussed birth spacing of 18 months  4. Sleep and fatigue -Encouraged family/partner/community support of 4 hrs of uninterrupted sleep to help with mood and fatigue  5. Physical Recovery  - Discussed patients delivery and complications. She describes her labor as good. - Patient had a Vaginal problems after delivery including laceration . Patient had a 3rd degree laceration. Perineal healing reviewed. Patient expressed understanding - Patient has urinary incontinence? No. - Patient is not safe to resume physical and sexual activity  6.  Health Maintenance - HM due items addressed Yes - Last pap smear  Diagnosis  Date Value Ref Range Status  11/24/2022   Final   - Negative for intraepithelial lesion or malignancy (NILM)   Pap smear not done at today's visit.  -Breast Cancer screening indicated? No.   7. Chronic Disease/Pregnancy Condition follow  up: Gestational Diabetes  - PCP follow up  Lowry Bowl, CMA Center for [employer], [facility]   Lorriane Shire, MD, FACOG Minimally Invasive Gynecologic Surgery  Obstetrics and Gynecology, [facility] for [facility], [facility] 06/17/2023

## 2023-06-18 LAB — CERVICOVAGINAL ANCILLARY ONLY
Bacterial Vaginitis (gardnerella): NEGATIVE
Candida Glabrata: NEGATIVE
Candida Vaginitis: NEGATIVE
Comment: NEGATIVE
Comment: NEGATIVE
Comment: NEGATIVE

## 2023-07-01 ENCOUNTER — Encounter: Payer: Self-pay | Admitting: Student

## 2023-07-01 ENCOUNTER — Ambulatory Visit: Payer: Medicaid Other | Admitting: Student

## 2023-07-01 VITALS — BP 109/70 | HR 91 | Ht 61.0 in | Wt 153.8 lb

## 2023-07-01 DIAGNOSIS — O24419 Gestational diabetes mellitus in pregnancy, unspecified control: Secondary | ICD-10-CM

## 2023-07-01 LAB — POCT GLYCOSYLATED HEMOGLOBIN (HGB A1C): Hemoglobin A1C: 5.5 % (ref 4.0–5.6)

## 2023-07-01 NOTE — Progress Notes (Signed)
    SUBJECTIVE:   CHIEF COMPLAINT / HPI:   F/u postpartum for GDM -G2P1011 delivered at 37.4 wk -Breastfeeding: yes  -Support system: good  -Patient has already had a post partum visit and is doing well.  -reports that she is only here for 2 hr gtt post partum for follow up of A2GDM -Her fasting sugars have been very good   PERTINENT  PMH / PSH:  H/o third degree perineal lac H/o A2GDM    OBJECTIVE:   BP 109/70   Pulse 91   Ht 5\' 1"  (1.549 m)   Wt 153 lb 12.8 oz (69.8 kg)   SpO2 98%   Breastfeeding Yes   BMI 29.06 kg/m   General: Alert and oriented in no apparent distress Heart: Regular rate and rhythm with no murmurs appreciated Lungs: CTA bilaterally, no wheezing Skin: Warm and dry   ASSESSMENT/PLAN:   Assessment & Plan Gestational diabetes mellitus (GDM), antepartum, gestational diabetes method of control unspecified Unable to obtain 2 hr gtt as the appt was made at 1100 and the lab closes before the 2 hr would be able to be completed. Ordered A1C today and future lab visit for 2 hr gtt. She will return tomorrow for this.      Alfredo Martinez, MD [facility]

## 2023-07-01 NOTE — Patient Instructions (Addendum)
It was great to see you today! Thank you for choosing [facility] for your primary care.  Today we addressed: We will check your sugar levels tomorrow at a lab visit at 0830 nonfasting  I will check A1C today   If you haven't already, sign up for My Chart to have easy access to your labs results, and communication with your primary care physician. We are checking some labs today. If they are abnormal, I will call you. If they are normal, I will send you a MyChart message (if it is active) or a letter in the mail. If you do not hear about your labs in the next 2 weeks, please call the office. I recommend that you always bring your medications to each appointment as this makes it easy to ensure you are on the correct medications and helps [place] not miss refills when you need them. Call the clinic at [phone] if your symptoms worsen or you have any concerns. Please arrive 15 minutes before your appointment to ensure smooth check in process.  We appreciate your efforts in making this happen.  Thank you for allowing me to participate in your care, Alfredo Martinez, MD 07/01/2023, 11:43 AM PGY-3, [facility]

## 2023-07-02 ENCOUNTER — Other Ambulatory Visit: Payer: Self-pay

## 2023-07-02 NOTE — Assessment & Plan Note (Signed)
Unable to obtain 2 hr gtt as the appt was made at 1100 and the lab closes before the 2 hr would be able to be completed. Ordered A1C today and future lab visit for 2 hr gtt. She will return tomorrow for this.

## 2023-07-03 LAB — GLUCOSE TOLERANCE, 2 HOURS

## 2023-09-30 ENCOUNTER — Ambulatory Visit: Payer: Medicaid Other | Admitting: Family Medicine

## 2023-09-30 ENCOUNTER — Ambulatory Visit: Payer: Medicaid Other | Admitting: Sports Medicine

## 2023-09-30 ENCOUNTER — Encounter: Payer: Self-pay | Admitting: Family Medicine

## 2023-09-30 VITALS — BP 103/74 | Ht 62.0 in | Wt 155.0 lb

## 2023-09-30 DIAGNOSIS — M222X1 Patellofemoral disorders, right knee: Secondary | ICD-10-CM | POA: Diagnosis present

## 2023-09-30 DIAGNOSIS — M222X2 Patellofemoral disorders, left knee: Secondary | ICD-10-CM | POA: Diagnosis not present

## 2023-09-30 MED ORDER — METHYLPREDNISOLONE ACETATE 40 MG/ML IJ SUSP
40.0000 mg | Freq: Once | INTRAMUSCULAR | Status: AC
Start: 2023-09-30 — End: 2023-09-30
  Administered 2023-09-30: 40 mg via INTRA_ARTICULAR

## 2023-09-30 NOTE — Progress Notes (Signed)
 Dawn Kennedy - 33 y.o. female MRN [id number]  Date of birth: 1991-03-01  PCP: Bryan Bianchi, MD  Subjective:  No chief complaint on file. Right greater than left knee pain  HPI: Past Medical, Surgical, Social, and Family History Reviewed & Updated per EMR.    Live interpreter utilized for this visit Patient is a 33 y.o. female here for bilateral knee pain that has achy, constant, and worse on the right.  She was seen previously by her primary care physician and diagnosed with patellofemoral pain syndrome.  She was unable to do her home exercises because her husband wanted her to wait until she saw another doctor.  She notes some swelling of the knee but no warmth, redness, fever, chills, unexplained weight loss.  She has not fallen on this knee or had any trauma to it in the past.  She does have to kneel in order to brace several times a day which exacerbates her symptoms.  She has not been consistently feeling any ice, Voltaren gel, or taking any oral medications.    Past Surgical History:  Procedure Laterality Date   NO PAST SURGERIES      No Known Allergies      Objective:  Physical Exam: VS: BP:103/74  HR: bpm  TEMP: ( )  RESP:   HT:5' 2 (157.5 cm)   WT:155 lb (70.3 kg)  BMI:28.34  Gen: Well developed, NAD, speaks clearly, comfortable in exam room Respiratory: Normal work of breathing on room air, no respiratory distress Skin: No rashes, abrasions, or ecchymosis MSK:   Bilateral knee: Inspection: There is some mild soft tissue edema of the right knee with a trace effusion, otherwise no abnormalities ROM: full  Strength: 5/5 in all directions Palpation w/ no warmth, trace effusion present, no posterior tenderness Mild lateral on the right knee joint line tenderness to palpation  no tenderness over Gerdy's tubercle, pes bursa, or tibial tuberosity no patellar tenderness, translation NT no condyle tenderness. Special Tests: varus/valgus stress normal Collateral  ligaments: anterior/posterior drawer normal Neurovascularly intact distally Gait normal   Assessment & Plan:   Patellofemoral pain syndrome of both knees - The patient's history and physical exam are consistent with patellofemoral pain syndrome.  No other red flag symptoms.  She was seen for this previously but was unable to perform the home exercises due to his social situation. - She has to kneel multiple times a day due to religious obligations which exacerbates her pain. - Unfortunately she is unable to attend formal physical therapy. - Home exercise printout, YouTube video, and in person demonstration was given for home VMO strengthening exercises today. - Injection for the right knee as below.  Hopefully this will give her some relief while she is working on the exercises to correct her muscle imbalance.  -We will see her back in 4 weeks to evaluate her progress.  If there is no improvement at that time we may consider further imaging.  Procedure: Risks and benefits of procedure were discussed with patient. After informed written consent was obtained, timeout was performed, patient was seated on exam table.  The right knee was prepped with alcohol swab and utilizing anterior lateral approach, patient's right knee was injected intraarticularly with 3:1 lidocaine :depomedrol. Patient tolerated the procedure well without immediate complications.  Postprocedural care was discussed and printed material was given.    Lonni Ford MD [facility] Fellow  Attestation     Addendum:  Patient seen and examined in the office by fellow.  History, exam, plan of care were precepted with me.  Agree with findings as documented in fellow note.  Dawn Cedar, DO, CAQSM

## 2023-09-30 NOTE — Assessment & Plan Note (Signed)
-   The patient's history and physical exam are consistent with patellofemoral pain syndrome.  No other red flag symptoms.  She was seen for this previously but was unable to perform the home exercises due to his social situation. - She has to kneel multiple times a day due to religious obligations which exacerbates her pain. - Unfortunately she is unable to attend formal physical therapy. - Home exercise printout, YouTube video, and in person demonstration was given for home VMO strengthening exercises today. - Injection for the right knee as below.  Hopefully this will give her some relief while she is working on the exercises to correct her muscle imbalance.  -We will see her back in 4 weeks to evaluate her progress.  If there is no improvement at that time we may consider further imaging.  Procedure: Risks and benefits of procedure were discussed with patient. After informed written consent was obtained, timeout was performed, patient was seated on exam table.  The right knee was prepped with alcohol swab and utilizing anterior lateral approach, patient's right knee was injected intraarticularly with 3:1 lidocaine :depomedrol. Patient tolerated the procedure well without immediate complications.  Postprocedural care was discussed and printed material was given.

## 2023-10-28 ENCOUNTER — Encounter: Payer: Self-pay | Admitting: Family Medicine

## 2023-10-28 ENCOUNTER — Ambulatory Visit: Payer: Medicaid Other | Admitting: Family Medicine

## 2023-10-28 VITALS — BP 108/72 | Ht 62.0 in | Wt 151.7 lb

## 2023-10-28 DIAGNOSIS — M222X1 Patellofemoral disorders, right knee: Secondary | ICD-10-CM

## 2023-10-28 DIAGNOSIS — M222X2 Patellofemoral disorders, left knee: Secondary | ICD-10-CM

## 2023-10-28 NOTE — Assessment & Plan Note (Signed)
-   Danijela reports doing much better and she is showing some strengthening of the VMO muscle.  The injection gave her marked relief. - We asked continuing the exercises at home and incorporating these into her daily routine, even when her knees feel better.  She will be focusing on both knees to make sure balanced strengthening. - She can use ice at night for anti-inflammatory therapy. - She will schedule an appointment for 4 weeks of follow-up.  However, if she is feeling much better at that point she can cancel her appointment.

## 2023-10-28 NOTE — Progress Notes (Signed)
 Dawn Kennedy - 33 y.o. female MRN [id number]  Date of birth: 05/19/1991  PCP: Bryan Bianchi, MD  Subjective:  No chief complaint on file. Bilateral knee pain  HPI: Past Medical, Surgical, Social, and Family History Reviewed & Updated per EMR.   Patient is a 33 y.o. female here for follow up on patellofemoral pain syndrome, last seen on last seen on 09/30/2023. The patient has tried home VMO strengthening exercises which has increased her strength and stability some and she reports her pain is much better.  She has been focusing on the right and sometimes working on the left.  She has been using heat but no ice and feels some pressure during exercises but no pain.  She has not had any fevers, chills, or new trauma to the area.  Past Medical History:  Diagnosis Date   Constipated    Encounter for health examination of refugee 12/17/2020   Patellofemoral syndrome of right knee 03/07/2021   Patellofemoral syndrome of right knee 03/07/2021   Seborrheic dermatitis 03/07/2021   Well woman exam (no gynecological exam) 11/22/2021    Current Outpatient Medications on File Prior to Visit  Medication Sig Dispense Refill   acetaminophen  (TYLENOL ) 325 MG tablet Take 2 tablets (650 mg total) by mouth every 4 (four) hours as needed (for pain scale < 4). (Patient not taking: Reported on 06/17/2023) 100 tablet 0   benzocaine -Menthol  (DERMOPLAST) 20-0.5 % AERO Apply 1 Application topically as needed for irritation (perineal discomfort). (Patient not taking: Reported on 06/17/2023) 85 g 0   ibuprofen  (ADVIL ) 600 MG tablet Take 1 tablet (600 mg total) by mouth every 6 (six) hours. (Patient not taking: Reported on 06/17/2023) 30 tablet 0   polyethylene glycol powder (GLYCOLAX /MIRALAX ) 17 GM/SCOOP powder Take 17 g by mouth daily. 238 g 3   Prenatal Vit-Fe Fumarate-FA (PRENATAL MULTIVITAMIN) TABS tablet Take 1 tablet by mouth daily at 12 noon. (Patient not taking: Reported on 06/17/2023) 30 tablet 0   Prenatal  Vit-Fe Fumarate-FA (PRENATAL VITAMIN) 27-0.8 MG TABS Take 1 tablet by mouth daily. (Patient not taking: Reported on 06/17/2023) 30 tablet 5   senna-docusate (SENOKOT-S) 8.6-50 MG tablet Take 2 tablets by mouth daily. (Patient not taking: Reported on 06/17/2023) 30 tablet 0   No current facility-administered medications on file prior to visit.    Past Surgical History:  Procedure Laterality Date   NO PAST SURGERIES      No Known Allergies      Objective:  Physical Exam: VS: BP:108/72  HR: bpm  TEMP: ( )  RESP:   HT:5' 2 (157.5 cm)   WT:151 lb 10.8 oz (68.8 kg)  BMI:27.73  Gen: NAD, speaks clearly, comfortable in exam room Respiratory: Normal respiratory effort on room air. No signs of distress Skin: No rashes, abrasions, or ecchymosis MSK:   Bilateral knee: Inspection: No obvious abnormalities.  There is slight genu valgus formation ROM: Full in flexion and extension Strength: 5/5  Palpation w/ no warmth, no effusion present no joint line tenderness to palpation  no patellar tenderness, translation NT no condyle tenderness. Special Tests: J sign positive with lateral tracking of the patella Patellar compression test is negative bilaterally   Assessment & Plan:   Patellofemoral pain syndrome of both knees - Dawn Kennedy reports doing much better and she is showing some strengthening of the VMO muscle.  The injection gave her marked relief. - We asked continuing the exercises at home and incorporating these into her daily routine, even when her knees feel  better.  She will be focusing on both knees to make sure balanced strengthening. - She can use ice at night for anti-inflammatory therapy. - She will schedule an appointment for 4 weeks of follow-up.  However, if she is feeling much better at that point she can cancel her appointment.    Lonni Ford MD [facility] Sports Medicine Fellow

## 2023-11-25 ENCOUNTER — Ambulatory Visit: Payer: Medicaid Other | Admitting: Family Medicine

## 2024-01-06 ENCOUNTER — Other Ambulatory Visit: Payer: Self-pay | Admitting: Student

## 2024-01-06 DIAGNOSIS — Z3490 Encounter for supervision of normal pregnancy, unspecified, unspecified trimester: Secondary | ICD-10-CM

## 2024-03-01 ENCOUNTER — Encounter: Payer: Self-pay | Admitting: *Deleted

## 2024-04-14 ENCOUNTER — Ambulatory Visit (INDEPENDENT_AMBULATORY_CARE_PROVIDER_SITE_OTHER)

## 2024-04-14 VITALS — BP 112/62 | HR 81 | Ht 62.0 in | Wt 152.4 lb

## 2024-04-14 DIAGNOSIS — Z3490 Encounter for supervision of normal pregnancy, unspecified, unspecified trimester: Secondary | ICD-10-CM

## 2024-04-14 DIAGNOSIS — N912 Amenorrhea, unspecified: Secondary | ICD-10-CM

## 2024-04-14 LAB — POCT URINE PREGNANCY: Preg Test, Ur: POSITIVE — AB

## 2024-04-14 MED ORDER — PRENATAL 27-0.8 MG PO TABS: 1.0000 | ORAL_TABLET | Freq: Every day | ORAL | 5 refills | Status: DC

## 2024-04-14 NOTE — Progress Notes (Signed)
 po

## 2024-04-14 NOTE — Progress Notes (Signed)
    SUBJECTIVE:   CHIEF COMPLAINT / HPI:   Dawn Kennedy is a 33 yo F G2P1 who presents to the office after a positive at home pregnancy test.  Patient states she has a regular menstrual cycle and the first day of her last known menstrual period was on March 04, 2024.  The patient took an at-home pregnancy test when she realized her period was late this month.  She has had 1 full-term pregnancy that was induced at 37 weeks due to gestational diabetes.  She denies any nausea, vomiting, abdominal pain, pelvic pain or cramping, vaginal bleeding, or any other complaints at this time.  PERTINENT  PMH / PSH: gestational diabetes  OBJECTIVE:   BP 112/62   Pulse 81   Ht 5' 2 (1.575 m)   Wt 152 lb 6.4 oz (69.1 kg)   SpO2 99%   BMI 27.87 kg/m   General: A&O, NAD, pleasant  HEENT: No sign of trauma, EOM grossly intact Cardiac: RRR, no m/r/g Respiratory: CTAB, normal WOB, no w/c/r GI: Soft, NTTP, non-distended, no masses, no rebound, no guarding, BS present in all 4 quadrants Extremities: NTTP, no peripheral edema Neuro: Normal gait, moves all four extremities appropriately. Psych: Appropriate mood and affect   ASSESSMENT/PLAN:   Assessment & Plan Pregnancy, unspecified gestational age Urine pregnancy positive in office. Patient denies any symptoms at this time. Worried about getting gestational diabetes again like she did during her first pregnancy. Discussed diet and exercise during pregnancy. -Follow up with our clinic for your first OB visit within the next month.  -OB labs done today in office, will contact with results.  -Will need 1 hour gtt at next OB visit due to h/o gestational diabetes.  Camie Dixons, DO [place] [facility]

## 2024-04-14 NOTE — Patient Instructions (Signed)
 Gestational Diabetes Mellitus, Diagnosis Gestational diabetes mellitus, or gestational diabetes, is diabetes that some people get when pregnant. This condition usually happens at 24-28 weeks of pregnancy.  People with gestational diabetes have high blood sugar. If you do not get treated for this condition, it may cause problems for you and your baby. It goes away after you give birth but can happen again the next time you become pregnant. What are the causes?It was wonderful to see you today.  Please bring ALL of your medications with you to every visit.   Today we talked about:  Your positive pregnancy test. Congratulations! Take your prenatal vitamins daily. These have been sent into your [facility]. Follow up for your OB visit.   Thank you for choosing [facility].   Please call [phone] with any questions about today's appointment.  Please arrive at least 15 minutes prior to your scheduled appointments.   If you had blood work today, I will send you a MyChart message or a letter if results are normal. Otherwise, I will give you a call.   If you had a referral placed, they will call you to set up an appointment. Please give us  a call if you don't hear back in the next 2 weeks.   If you need additional refills before your next appointment, please call your pharmacy first.   You should follow up in our clinic for your OB appointment.  Camie Dixons, DO Family Medicine    This condition is caused by changes in your body when you're pregnant. When these changes happen: The pancreas may not make enough insulin . The body may not use insulin  in the right way. This is called insulin  resistance. Insulin  helps your body use sugar for energy. If your body doesn't have enough insulin  or can't use the insulin  that it has, extra sugars stay in your blood. This leads to high blood sugar (hyperglycemia). What increases the risk? Being older than age 23 when  pregnant. Too much body weight. Having polycystic ovary syndrome (PCOS). Having someone with diabetes in your family. Having had this condition in the past. Being pregnant with more than one baby. What are the signs or symptoms? You may not have any symptoms. If you do have symptoms, they may include: Being thirsty often. Being hungry often. Needing to pee more often. These symptoms can be missed because they're similar to other symptoms of pregnancy. How is this diagnosed? This condition may be diagnosed based on your blood sugar level and how your body responds to glucose. This may be checked with an oral glucose tolerance test (OGTT). The test may be done: Early in pregnancy if you have risk factors. At 24-28 weeks of your pregnancy. How is this treated? To treat this condition, you may be told to: Eat a healthy diet. Get more exercise. Check your blood sugar often. Your health care provider will tell you what your target is. Take insulin  and other medicines. These are taken if needed. Work with a diabetes expert. Follow these instructions at home: Learn about your diabetes Ask your provider: How often should I check my blood sugar? Where do I get the equipment? What medicines do I need? When should I take them? Do I need to meet with an educator? Who can I call if I have questions? Where can I find a support group? General instructions Take medicines only as told by your provider. Stay at a healthy weight. Drink enough fluid to keep your pee (  urine) pale yellow. Check your pee for ketones when sick and as told. Ketones in your pee is a sign that your body is using fat for energy because it's not making enough insulin . Wear an alert bracelet or carry a card that shows you have this condition. Keep all follow-up visits. Your provider needs to check your health and your baby's growth. Where to find more information Gestational Diabetes: American Diabetes Association (ADA):  diabetes.org Gestational Diabetes and Pregnancy: Centers for Disease Control and Prevention (CDC): TonerPromos.no American Pregnancy Association: americanpregnancy.org [place] MyPlate: WrestlingReporter.dk Contact a health care provider if:  Your blood sugar is above your target for two tests in a row. You have a high blood sugar level and you also have ketones in your pee. You have been sick or have had a fever for 2 days or more and aren't getting better. You have any of these problems for more than 6 hours: You can't eat or drink. You vomit or feel like you may vomit. You have watery poop (diarrhea). Get help right away if: You become confused or cannot think clearly. You have trouble breathing. You have chest pain. Your baby is moving less than usual. You have unusual discharge or bleeding from your vagina. You have cramping in your belly or have pain in your hips or lower back. You have symptoms of high blood pressure or preeclampsia. These include: A severe, throbbing headache that doesn't go away. Sudden or extreme swelling of your face, hands, legs, or feet. Vision problems, such as: Seeing spots. Blurry vision. Sensitivity to light. These symptoms may be an emergency. Get help right away. Call 911. Do not wait to see if the symptoms will go away. Do not drive yourself to the hospital. This information is not intended to replace advice given to you by your health care provider. Make sure you discuss any questions you have with your health care provider. Document Revised: 06/11/2023 Document Reviewed: 12/19/2022 Elsevier Patient Education  2024 [employer].

## 2024-04-16 LAB — CBC/D/PLT+RPR+RH+ABO+RUBIGG...
Antibody Screen: NEGATIVE
Basophils Absolute: 0.1 x10E3/uL (ref 0.0–0.2)
Basos: 1 %
Bilirubin, UA: NEGATIVE
EOS (ABSOLUTE): 0.3 x10E3/uL (ref 0.0–0.4)
Eos: 2 %
Glucose, UA: NEGATIVE
HCV Ab: NONREACTIVE
HIV Screen 4th Generation wRfx: NONREACTIVE
Hematocrit: 40.6 % (ref 34.0–46.6)
Hemoglobin: 13.2 g/dL (ref 11.1–15.9)
Hepatitis B Surface Ag: NEGATIVE
Immature Grans (Abs): 0 x10E3/uL (ref 0.0–0.1)
Immature Granulocytes: 0 %
Ketones, UA: NEGATIVE
Leukocytes,UA: NEGATIVE
Lymphocytes Absolute: 3.1 x10E3/uL (ref 0.7–3.1)
Lymphs: 26 %
MCH: 29.6 pg (ref 26.6–33.0)
MCHC: 32.5 g/dL (ref 31.5–35.7)
MCV: 91 fL (ref 79–97)
Monocytes Absolute: 1.2 x10E3/uL — ABNORMAL HIGH (ref 0.1–0.9)
Monocytes: 10 %
Neutrophils Absolute: 7.2 x10E3/uL — ABNORMAL HIGH (ref 1.4–7.0)
Neutrophils: 61 %
Nitrite, UA: NEGATIVE
Platelets: 342 x10E3/uL (ref 150–450)
Protein,UA: NEGATIVE
RBC, UA: NEGATIVE
RBC: 4.46 x10E6/uL (ref 3.77–5.28)
RDW: 12.4 % (ref 11.7–15.4)
RPR Ser Ql: NONREACTIVE
Rh Factor: NEGATIVE
Rubella Antibodies, IGG: 1.76 {index} (ref >0.99)
Specific Gravity, UA: 1.023 (ref 1.005–1.030)
Urobilinogen, Ur: 0.2 mg/dL (ref 0.2–1.0)
WBC: 11.8 x10E3/uL — ABNORMAL HIGH (ref 3.4–10.8)
pH, UA: 6.5 (ref 5.0–7.5)

## 2024-04-16 LAB — MICROSCOPIC EXAMINATION
Casts: NONE SEEN /LPF
Epithelial Cells (non renal): >10 /HPF — AB (ref 0–10)
RBC, Urine: NONE SEEN /HPF (ref 0–2)
WBC, UA: NONE SEEN /HPF (ref 0–5)

## 2024-04-16 LAB — URINE CULTURE, OB REFLEX

## 2024-04-16 LAB — HCV INTERPRETATION

## 2024-04-18 ENCOUNTER — Telehealth: Payer: Self-pay

## 2024-04-18 ENCOUNTER — Other Ambulatory Visit: Payer: Self-pay

## 2024-04-18 DIAGNOSIS — B379 Candidiasis, unspecified: Secondary | ICD-10-CM

## 2024-04-18 MED ORDER — MICONAZOLE NITRATE 2 % VA CREA: 1.0000 | TOPICAL_CREAM | Freq: Every day | VAGINAL | Status: DC

## 2024-04-18 NOTE — Telephone Encounter (Signed)
 Spoke to patient regarding yeast found on urine results. She states she is currently having some vaginal discharge. I have sent in 7 days of miconazole  vaginal cream to her pharmacy to treat. She verbalizes understanding.

## 2024-04-18 NOTE — Telephone Encounter (Signed)
 LVM for patient to call me back to discuss labs.

## 2024-04-20 ENCOUNTER — Encounter

## 2024-04-21 ENCOUNTER — Other Ambulatory Visit: Payer: Self-pay

## 2024-04-21 ENCOUNTER — Telehealth: Payer: Self-pay

## 2024-04-21 MED ORDER — MICONAZOLE NITRATE 2 % VA CREA: 1.0000 | TOPICAL_CREAM | Freq: Every day | VAGINAL | 0 refills | Status: DC

## 2024-04-21 NOTE — Telephone Encounter (Signed)
Attempted to call patient, however no answer or option for VM.

## 2024-04-21 NOTE — Telephone Encounter (Signed)
 Patient calls nurse line in regards to vaginal cream.   She reports [facility] never received this prescription.   The prescription was sent in as a clinic administered medication by mistake.   Will forward to provider to send in to [facility] on [address].

## 2024-04-29 ENCOUNTER — Encounter: Payer: Self-pay | Admitting: Family Medicine

## 2024-04-29 ENCOUNTER — Ambulatory Visit: Admitting: Family Medicine

## 2024-04-29 ENCOUNTER — Ambulatory Visit: Payer: Self-pay | Admitting: Family Medicine

## 2024-04-29 VITALS — BP 109/75 | HR 79 | Wt 153.0 lb

## 2024-04-29 DIAGNOSIS — O09299 Supervision of pregnancy with other poor reproductive or obstetric history, unspecified trimester: Secondary | ICD-10-CM

## 2024-04-29 DIAGNOSIS — Z8632 Personal history of gestational diabetes: Secondary | ICD-10-CM

## 2024-04-29 DIAGNOSIS — Z3402 Encounter for supervision of normal first pregnancy, second trimester: Secondary | ICD-10-CM | POA: Insufficient documentation

## 2024-04-29 DIAGNOSIS — Z3491 Encounter for supervision of normal pregnancy, unspecified, first trimester: Secondary | ICD-10-CM

## 2024-04-29 DIAGNOSIS — B379 Candidiasis, unspecified: Secondary | ICD-10-CM | POA: Diagnosis not present

## 2024-04-29 DIAGNOSIS — O0993 Supervision of high risk pregnancy, unspecified, third trimester: Secondary | ICD-10-CM | POA: Insufficient documentation

## 2024-04-29 DIAGNOSIS — Z3490 Encounter for supervision of normal pregnancy, unspecified, unspecified trimester: Secondary | ICD-10-CM | POA: Diagnosis not present

## 2024-04-29 LAB — POCT 1 HR PRENATAL GLUCOSE: Glucose 1 Hr Prenatal, POC: 187 mg/dL

## 2024-04-29 MED ORDER — MICONAZOLE NITRATE 2 % VA CREA: 1.0000 | TOPICAL_CREAM | Freq: Every day | VAGINAL | 0 refills | Status: DC

## 2024-04-29 NOTE — Patient Instructions (Addendum)
 Thank you for choosing our clinic to help take care of you!   I have sent your yeast medication to the pharmacy. Please let us  know if it is not available.   Please continue taking your vitamins daily.   Prenatal Classes Go to [email or web address] for more information on the pregnancy and child birth classes that [facility] has to offer.   Pregnancy Related Return Precautions The follow are signs/symptoms that are abnormal in pregnancy and may require further evaluation by a physician: Go to the MAU at [facility] if: You have cramping/contractions that do not go away with drinking water, especially if they are lasting 30 seconds to 1.5 minutes, coming and going every 5-10 minutes for an hour or more, or are getting stronger and you cannot walk or talk while having a contraction/cramp. Your water breaks.  Sometimes it is a big gush of fluid, sometimes it is just a trickle that keeps getting your underwear wet or running down your legs You have vaginal bleeding.    You do not feel your baby moving like normal.  If you do not, get something to eat and drink (something cold or something with sugar like peanut butter or juice) and lay down and focus on feeling your baby move. If your baby is still not moving like normal, you should go to MAU. You should feel your baby move 6 times in one hour, or 10 times in two hours. You have a persistent headache that does not go away with 1 g of Tylenol , vision changes, chest pain, difficulty breathing, severe pain in your right upper abdomen, worsening leg swelling- these can all be signs of high blood pressure in pregnancy and need to be evaluated by a provider immediately  These are all concerning in pregnancy and if you have any of these I recommend you call your PCP and present to the Maternity Admissions Unit (map below) for further evaluation.  For any pregnancy-related emergencies, please go  to the Maternity Admissions Unit in the [facility]'s [facility]'s Center at [facility]. You will use hospital Entrance C.    Our clinic number is [phone].    ???? ?? ????? ?????? ?? ?? ???? ??? ?? ?????? ?? ??? ?????? ?????!   ?? ??? ???? ??? ?? ?? ??????? ??????? ??. ??? ????? ???? ????? ?? ?? ????? ????.   ????? ?? ???? ?????? ??????? ??? ??? ????? ????.  ??? ??? ??? ?? ???? ???? ??????? ????? ?? ???? ??? ??? ????? ???? ? ???? ??? ?? ??? ???? ????? ????? ??  [email or web address] ?????.  ?????? ??? ????? ????? ?? ????? ???? ?????/????? ??? ?? ?????? ???????? ??? ? ???? ???? ?? ??????? ????? ???? ?? ????? ????? ????: ?? MAU ?? ???? ???? ? ????? ?? ???? ??? ????? ???: ? ??? ?????? / ???????? ????? ?? ?? ?????? ?? ?? ??? ??????? ??????? ??? ???? 30 ????? ?? 1.5 ????? ???? ????? ?? 5-10 ????? ???? ?? ???? ?? ????? ?? ???? ? ?? ????? ?? ??? ?? ?? ???? ? ??? ??? ?????? ?? ?????? ?????? / ?????? ????? ??? ????? ?? ???? ????. ? ?? ??? ??????.  ???? ????? ??? ?? ????? ???? ???? ???? ???? ????? ??? ??? ?? ????? ??? ?? ???? ??? ??? ?? ??? ????? ?? ?? ????? ??? ???? ?????? ? ??? ??????? ???? ?????.    ? ??? ????? ??????? ?? ??? ??? ????? ???? ???? ???? ?????.  ??? ??? ??? ??? ?? ????? ???????? ???? ???? ????? ? ?????? (???? ??? ?? ???? ?? ??? ????? ??? ????? ????? ?? ?? ????) ?????? ? ???? ????? ? ??? ????? ???? ??? ??? ????? ????. ??? ??? ??? ???? ?? ????? ???? ???? ???? ??????? ??? ???? ??  MAU ?????. ??? ???? ????? ???? ?? ??? ??? ?? ?? ???? 6 ??? ???? ?????? ?? 10 ??? ?? ?? ????. ? ??? ?? ?????? ??????? ????? ?? ?? 1 ??? ??????? ?? ??? ??????? ??????? ??????? ??? ???? ????? ???? ????? ??? ???? ?? ????? ??? ??? ???? ???? ???? ??? ???? ?? - ????? ???????? ????? ??? ???? ??? ???? ?? ?????? ???? ? ???? ????? ???? ?? ????? ????? ??????? ????   ????? ??? ?? ????? ???? ????? ????? ??? ? ??? ??? ?? ?? ?? ????? ?? ????? ?? ????? ?? ??? ?? ?? PCP ??? ???? ?????? ?  ???? ??????? ????? ?? ???? ????? ?????? (???? ???) ?????? ????.  ???? ?? ???? ??????? ????? ?? ????? ????? ????? ?? ???? ????? ?????? ?? ???? ???? ? ????? ?? ??????? ???? ??? ?????? ????. ??? ?? ????? C ??????? ??????? ?????? ???. ????? ?????? ?? [phone] ???.

## 2024-04-29 NOTE — Progress Notes (Addendum)
 Patient Name: Dawn Kennedy Date of Birth: 03-Nov-1990 [facility] Initial Prenatal Visit  Dawn Kennedy is a 33 y.o. year old G3P1011 at [redacted]w[redacted]d who presents for her initial prenatal visit. Pregnancy is planned She reports some fatigue. She is taking a prenatal vitamin.  She denies pelvic pain or vaginal bleeding.   Pregnancy Dating: The patient is dated by LMP, exact.  LMP: 03/04/2024 Period is certain:  Yes.  Periods were regular:  Yes.  LMP was a typical period:  Yes.  Using hormonal contraception in 3 months prior to conception: No  Lab Review: Blood type: AB Rh Status: - Antibody screen: Negative HIV: Negative RPR: Negative Hemoglobin electrophoresis reviewed: Yes Results of OB urine culture are: Positive for lactobacillus 50-100k  Rubella: Immune Hep C Ab: Negative Varicella status is Immune  PMH: Reviewed and as detailed below: HTN: No  Gestational Hypertension/preeclampsia: No  Type 1 or 2 Diabetes: No  Depression:  No  Seizure disorder:  No VTE: No ,  History of STI No,  Abnormal Pap smear:  No, Genital herpes simplex:  No   PSH: Gynecologic Surgery:  no Surgical history reviewed, notable for: none  Obstetric History: Obstetric history tab updated and reviewed.  Summary of prior pregnancies: TAB, 2nd pregnancy Induced for GDM and LGA, VAVD for maternal exhaustion, 3rd degree laceration  Cesarean delivery: No  Gestational Diabetes:  Yes, was on metformin   Hypertension in pregnancy: No History of preterm birth: No History of LGA/SGA infant:  Yes, induced at 37 weeks,  History of shoulder dystocia: No Indications for referral were reviewed, and the patient has no obstetric indications for referral to High Risk OB Clinic at this time.   Social History: Partner's name: Karlene   Tobacco use: No Alcohol use:  No Other substance use:  No  Financial Assistance:  Adopt A Mom Patient?: No  If AAM, have they contacted Eric to enroll in  [employer]?: n/a  Current Medications:  Prenatal vitamins  Reviewed and appropriate in pregnancy.   Genetic and Infection Screen: Flow Sheet Updated Yes  Prenatal Exam: Gen: Well nourished, well developed.  No distress.  Vitals noted. HEENT: Normocephalic, atraumatic.  Neck supple without cervical lymphadenopathy, thyromegaly or thyroid nodules.  Fair dentition. CV: RRR no murmur, gallops or rubs Lungs: CTA B.  Normal respiratory effort without wheezes or rales. Abd: soft, NTND. +BS.  Uterus not appreciated above pelvis. GU: declined Ext: No clubbing, cyanosis or edema. Psych: Normal grooming and dress.  Not depressed or anxious appearing.  Normal thought content and process without flight of ideas or looseness of associations  Fetal heart tones: not taken as less than 12 weeks*  Assessment/Plan:  Dawn Kennedy is a 33 y.o. G3P1011 at Unknown who presents to initiate prenatal care. She is doing well.  Current pregnancy issues include none.  Routine prenatal care: As dating is reliable, a dating ultrasound has not been ordered. Dating tab updated. Pre-pregnancy weight updated. Expected weight gain this pregnancy is 15-25 pounds Prenatal labs reviewed, notable for yeast in urine - prescribed miconazole  vaginal suppository. Indications for referral to HROB were reviewed and the patient does not meet criteria for referral.  Medication list reviewed and updated.  Recommended patient see a dentist for regular care.  Bleeding and pain precautions reviewed. Importance of prenatal vitamins reviewed.  Genetic screening offered. Patient opted for: no screening. The patient has the following indications for aspirinto begin 81 mg at 12-16 weeks: One high risk condition: no single  high risk condition  MORE than one moderate risk condition: low SES   Aspirin  was not  recommended today based upon above risk factors (one high risk condition or more than one moderate risk factor)   The patient will not be age 33 or over at time of delivery. Referral to genetic counseling was not offered today.  The patient has the following risk factors for preexisting diabetes: BMI >25 and history of GDM . An early 1 hour glucose tolerance test was ordered. Pregnancy Medical Home and PHQ-9 forms completed, problems noted: Yes  2. Pregnancy issues include the following which were addressed today:  History of GDM - 1hr GTT ordered today  Yeast in ua at last visit - Patient said miconozole was not available at the pharmacy last visit, so was sent again  Failed 1 hr gtt has follow up for 3 hr gtt on 05/06/2024.    Follow up 4 weeks for next prenatal visit.

## 2024-05-06 ENCOUNTER — Other Ambulatory Visit

## 2024-05-09 ENCOUNTER — Other Ambulatory Visit

## 2024-05-13 ENCOUNTER — Other Ambulatory Visit (INDEPENDENT_AMBULATORY_CARE_PROVIDER_SITE_OTHER)

## 2024-05-13 DIAGNOSIS — O09299 Supervision of pregnancy with other poor reproductive or obstetric history, unspecified trimester: Secondary | ICD-10-CM

## 2024-05-13 DIAGNOSIS — Z3490 Encounter for supervision of normal pregnancy, unspecified, unspecified trimester: Secondary | ICD-10-CM | POA: Diagnosis present

## 2024-05-13 DIAGNOSIS — Z8632 Personal history of gestational diabetes: Secondary | ICD-10-CM | POA: Diagnosis not present

## 2024-05-14 LAB — GESTATIONAL GLUCOSE TOLERANCE
Glucose, Fasting: 96 mg/dL — ABNORMAL HIGH (ref 70–94)
Glucose, GTT - 1 Hour: 166 mg/dL (ref 70–179)
Glucose, GTT - 2 Hour: 76 mg/dL (ref 70–154)
Glucose, GTT - 3 Hour: 103 mg/dL (ref 70–139)

## 2024-05-16 LAB — POCT CBG (FASTING - GLUCOSE)-MANUAL ENTRY: Glucose Fasting, POC: 112 mg/dL — AB (ref 70–99)

## 2024-05-24 ENCOUNTER — Ambulatory Visit (INDEPENDENT_AMBULATORY_CARE_PROVIDER_SITE_OTHER): Payer: Self-pay | Admitting: Family Medicine

## 2024-05-24 VITALS — BP 95/63 | HR 89 | Temp 98.8°F | Wt 156.2 lb

## 2024-05-24 DIAGNOSIS — Z3491 Encounter for supervision of normal pregnancy, unspecified, first trimester: Secondary | ICD-10-CM

## 2024-05-24 DIAGNOSIS — Z23 Encounter for immunization: Secondary | ICD-10-CM

## 2024-05-24 DIAGNOSIS — L710 Perioral dermatitis: Secondary | ICD-10-CM | POA: Diagnosis not present

## 2024-05-24 MED ORDER — HYDROCORTISONE 1 % EX OINT: 1.0000 | TOPICAL_OINTMENT | Freq: Two times a day (BID) | CUTANEOUS | 0 refills | Status: DC

## 2024-05-24 NOTE — Progress Notes (Signed)
  Patient Name: Maxene Hackert Date of Birth: 15-Aug-1991 [facility] Prenatal Visit  Holy Battenfield is a 33 y.o. G3P1011 at [redacted]w[redacted]d here for routine follow up. She is dated by LMP.  She reports no complaints.  She denies vaginal bleeding.  See flow sheet for details.  Vitals:   05/24/24 1413  BP: 95/63  Pulse: 89  Temp: 98.8 F (37.1 C)     A/P: Pregnancy at [redacted]w[redacted]d.  Doing well.    Routine Prenatal Care:  Dating reviewed, dating tab is correct Fetal heart tones not taken today given < 12 weeks  Influenza vaccine administered today.   COVID vaccination was discussed and patient declined.  The patient has the following indication for screening preexisting diabetes: BMI >25 and history of GDM . Patient failed 1 hr; however, early 3 hr gtt was negative.  Anatomy ultrasound ordered to be scheduled at 18-20 weeks. Patient is not interested in genetic screening.  Pregnancy education including expected weight gain in pregnancy, OTC medication use, continued use of prenatal vitamin, smoking cessation if applicable, and nutrition in pregnancy.   Bleeding and pain precautions reviewed. The patient has the following indications for aspirinto begin 81 mg at 12-16 weeks: One high risk condition: no single high risk condition  MORE than one moderate risk condition: none Aspirin  was not  recommended today based upon above risk factors (one high risk condition or more than one moderate risk factor)   2. Pregnancy issues include the following and were addressed as appropriate today:  Perioral dermatitis - Patient has concerns of irritation surrounding mouth area. Denies lip sucking/licking or new cosmetic products. Prescribed hydrocortisone . Can follow up at next visit.  Problem list  and pregnancy box updated: Yes.   Follow up 4 weeks.

## 2024-05-24 NOTE — Patient Instructions (Signed)
 Prenatal Classes Go to [email or web address] for more information on the pregnancy and child birth classes that [facility] has to offer.   Financial Assistance To enroll in [facility], please call [phone] Ask to speak with Eric. He speaks [place].  Eric is located at: [address] [place], [place]  His office is open Tuesday, Wednesday, Thursday from 8:30a to 4:30p (Closed Monday & Friday)  Vaccinations If you are with the Adopt a Mom Program and need vaccines, these are done the [facility] on [address] [place]. The number to call to schedule an appt is [phone]  Pregnancy Related Return Precautions The follow are signs/symptoms that are abnormal in pregnancy and may require further evaluation by a physician: Go to the MAU at [facility] at [facility] if: You have cramping/contractions that do not go away with drinking water, especially if they are lasting 30 seconds to 1.5 minutes, coming and going every 5-10 minutes for an hour or more, or are getting stronger and you cannot walk or talk while having a contraction/cramp. Your water breaks.  Sometimes it is a big gush of fluid, sometimes it is just a trickle that keeps getting your underwear wet or running down your legs You have vaginal bleeding.    You do not feel your baby moving like normal.  If you do not, get something to eat and drink (something cold or something with sugar like peanut butter or juice) and lay down and focus on feeling your baby move. If your baby is still not moving like normal, you should go to MAU. You should feel your baby move 6 times in one hour, or 10 times in two hours. You have a persistent headache that does not go away with 1 g of Tylenol , vision changes, chest pain, difficulty breathing, severe pain in your right upper abdomen, worsening leg swelling- these can all be signs of high  blood pressure in pregnancy and need to be evaluated by a provider immediately  These are all concerning in pregnancy and if you have any of these I recommend you call your PCP and present to the Maternity Admissions Unit (map below) for further evaluation.  For any pregnancy-related emergencies, please go to the Maternity Admissions Unit in the Women's & Children's Center at [facility]. You will use hospital Entrance C.    Our clinic number is [phone].

## 2024-06-08 ENCOUNTER — Telehealth: Payer: Self-pay

## 2024-06-10 NOTE — Telephone Encounter (Signed)
 Notes: Reached out to pt via telephone call re: initial ob visit on 8/8 to offer consent to [employer]. Discussed how [employer] can provide connection to resources in the area that mom may not be aware of as well as offer support throughout the longevity of the pregnancy. Patient consented to Prenatal Navigation and guided conversation assessing non-medical needs in the household was completed. Appropriate resources and referrals provided.  Antoinette Borgwardt Waddell Community Navigator [facility] [place] Direct Dial: [phone]

## 2024-06-17 ENCOUNTER — Ambulatory Visit (INDEPENDENT_AMBULATORY_CARE_PROVIDER_SITE_OTHER): Payer: Self-pay | Admitting: Family Medicine

## 2024-06-17 VITALS — BP 106/69 | HR 84 | Temp 98.1°F | Wt 155.2 lb

## 2024-06-17 DIAGNOSIS — Z3402 Encounter for supervision of normal first pregnancy, second trimester: Secondary | ICD-10-CM

## 2024-06-17 DIAGNOSIS — Z3491 Encounter for supervision of normal pregnancy, unspecified, first trimester: Secondary | ICD-10-CM

## 2024-06-17 DIAGNOSIS — L710 Perioral dermatitis: Secondary | ICD-10-CM

## 2024-06-17 MED ORDER — METRONIDAZOLE 0.75 % EX GEL: 1.0000 | Freq: Every day | CUTANEOUS | 0 refills | Status: DC

## 2024-06-17 NOTE — Progress Notes (Signed)
  Patient Name: Dawn Kennedy Date of Birth: 06-Aug-1991 [facility] Prenatal Visit  [name] is a 33 y.o. G3P1011 at [redacted]w[redacted]d here for routine follow up. She is dated by LMP.  She reports no complaints.  She denies vaginal bleeding.  See flow sheet for details.  Vitals:   06/17/24 1340  BP: 106/69  Pulse: 84  Temp: 98.1 F (36.7 C)     A/P: Pregnancy at [redacted]w[redacted]d.  Doing well.    Routine Prenatal Care:  Dating reviewed, dating tab is correct Fetal heart tones Appropriate Influenza vaccine previously administered.   COVID vaccination was discussed and patient declined.  The patient has the following indication for screening preexisting diabetes: BMI >25 and history of GDM . Patient had early screening, failed 1 hr; however, early 3 hr gtt was negative previously.  Anatomy ultrasound ordered to be scheduled at 18-20 weeks. Patient is interested in genetic screening, panorama ordered  Pregnancy education including expected weight gain in pregnancy, OTC medication use, continued use of prenatal vitamin, smoking cessation if applicable, and nutrition in pregnancy.   Bleeding and pain precautions reviewed. The patient has the following indications for aspirinto begin 81 mg at 12-16 weeks: One high risk condition: no single high risk condition  MORE than one moderate risk condition: none Aspirin  was not  recommended today   2. Pregnancy issues include the following and were addressed as appropriate today:  Perioral dermatitis -was previously given steroid cream which improved her symptoms if at all. Advised patient to stop steroid cream now and start metronidazole  gel. Plan to use this for four weeks informing her to avoid getting on the lips as to not consume any. Patient is of second trimester. I counseled her that we usually avoid oral metronidazole  in first trimester. Given it is her second trimester and this is a topical medication less likely patient would have  systemic absorption. Patient understood and did not have questions.  Problem list  and pregnancy box updated: Yes.   Patient is transitioning care to [facility] due to proximity to her home. She has follow up appointment on 10/10.

## 2024-06-17 NOTE — Patient Instructions (Signed)
 The nurse will call you to let you know when your ultrasound is scheduled.   I will let you know the result of the genetic testing.   For your perioral dermatitis (irritation around your mouth) stop the steroid cream and start this antibiotic gel. Let us  know if you have any side effects. Be careful that you do not accidentally consume any of it. Try to not get it on your lips. Do this for 4 weeks and follow up with whoever your healthcare provider is at that time about stopping the medication.   Prenatal Classes Go to [email or web address] for more information on the pregnancy and child birth classes that [facility] has to offer.   Financial Assistance To enroll in [facility], please call [phone] Ask to speak with Eric. He speaks [place].  Eric is located at: [address] [place], [place]  His office is open Tuesday, Wednesday, Thursday from 8:30a to 4:30p (Closed Monday & Friday)  Vaccinations If you are with the Adopt a Mom Program and need vaccines, these are done the [facility] on [address] [place]. The number to call to schedule an appt is [phone]  Pregnancy Related Return Precautions The follow are signs/symptoms that are abnormal in pregnancy and may require further evaluation by a physician: Go to the MAU at [facility] at [facility] if: You have cramping/contractions that do not go away with drinking water, especially if they are lasting 30 seconds to 1.5 minutes, coming and going every 5-10 minutes for an hour or more, or are getting stronger and you cannot walk or talk while having a contraction/cramp. Your water breaks.  Sometimes it is a big gush of fluid, sometimes it is just a trickle that keeps getting your underwear wet or running down your legs You have vaginal bleeding.    You do not feel your baby moving like normal.  If you do not, get  something to eat and drink (something cold or something with sugar like peanut butter or juice) and lay down and focus on feeling your baby move. If your baby is still not moving like normal, you should go to MAU. You should feel your baby move 6 times in one hour, or 10 times in two hours. You have a persistent headache that does not go away with 1 g of Tylenol , vision changes, chest pain, difficulty breathing, severe pain in your right upper abdomen, worsening leg swelling- these can all be signs of high blood pressure in pregnancy and need to be evaluated by a provider immediately  These are all concerning in pregnancy and if you have any of these I recommend you call your PCP and present to the Maternity Admissions Unit (map below) for further evaluation.  For any pregnancy-related emergencies, please go to the Maternity Admissions Unit in the Women's & Children's Center at [facility]. You will use hospital Entrance C.    Our clinic number is [phone].

## 2024-06-18 NOTE — Addendum Note (Signed)
 Addended by: Jordanna Hendrie on: 06/18/2024 03:08 AM   Modules accepted: Level of Service

## 2024-06-19 ENCOUNTER — Encounter: Payer: Self-pay | Admitting: Family Medicine

## 2024-06-19 DIAGNOSIS — O26899 Other specified pregnancy related conditions, unspecified trimester: Secondary | ICD-10-CM | POA: Insufficient documentation

## 2024-06-20 ENCOUNTER — Other Ambulatory Visit: Payer: Self-pay | Admitting: Family Medicine

## 2024-06-20 DIAGNOSIS — Z3402 Encounter for supervision of normal first pregnancy, second trimester: Secondary | ICD-10-CM

## 2024-06-21 ENCOUNTER — Telehealth: Payer: Self-pay

## 2024-06-21 NOTE — Telephone Encounter (Signed)
 Patient called asking about her ultrasound that was ordered at her last visit. Stated that she was told she should get a call Monday, but did not hear from anyone. Asks if we could please call her

## 2024-06-21 NOTE — Telephone Encounter (Signed)
 Patient informed of appointment. Cassell Mary CMA

## 2024-06-23 LAB — PANORAMA PRENATAL TEST FULL PANEL:PANORAMA TEST PLUS 5 ADDITIONAL MICRODELETIONS: FETAL FRACTION: 7.3

## 2024-06-24 ENCOUNTER — Ambulatory Visit: Payer: Self-pay | Admitting: Family Medicine

## 2024-07-01 ENCOUNTER — Ambulatory Visit (HOSPITAL_BASED_OUTPATIENT_CLINIC_OR_DEPARTMENT_OTHER): Admitting: Certified Nurse Midwife

## 2024-07-01 ENCOUNTER — Other Ambulatory Visit (HOSPITAL_COMMUNITY)
Admission: RE | Admit: 2024-07-01 | Discharge: 2024-07-01 | Disposition: A | Discharge status: Home / Self Care | Source: Ambulatory Visit | Attending: Certified Nurse Midwife | Admitting: Certified Nurse Midwife

## 2024-07-01 VITALS — BP 102/74 | HR 70 | Wt 156.4 lb

## 2024-07-01 DIAGNOSIS — Z3A17 17 weeks gestation of pregnancy: Secondary | ICD-10-CM

## 2024-07-01 DIAGNOSIS — Z1332 Encounter for screening for maternal depression: Secondary | ICD-10-CM | POA: Diagnosis not present

## 2024-07-01 DIAGNOSIS — Z3402 Encounter for supervision of normal first pregnancy, second trimester: Secondary | ICD-10-CM

## 2024-07-01 DIAGNOSIS — Z3492 Encounter for supervision of normal pregnancy, unspecified, second trimester: Secondary | ICD-10-CM | POA: Diagnosis present

## 2024-07-01 MED ORDER — BLOOD PRESSURE KIT DEVI: 1.0000 | Freq: Every day | 0 refills | Status: AC

## 2024-07-01 NOTE — Progress Notes (Signed)
     PRENATAL VISIT NOTE  Subjective:  Dawn Kennedy is a 33 y.o. G3P1011 at [redacted]w[redacted]d transferring in for prenatal care. Having a daughter. Has a healthy daughter at home. Reports hx GDM in prior pregnancy. Taking PNV.    She is currently monitored for the following issues for this low-risk pregnancy and has Patellofemoral pain syndrome of both knees; Supervision of low-risk first pregnancy, second trimester; and Rh negative state in antepartum period on their problem list.  Patient reports no bleeding, no contractions, no cramping, and no leaking.  Contractions: Not present. Vag. Bleeding: None.  Movement: Present. Denies leaking of fluid.   The following portions of the patient's history were reviewed and updated as appropriate: allergies, current medications, past family history, past medical history, past social history, past surgical history and problem list.   Objective:    Vitals:   07/01/24 0837  BP: 102/74  Pulse: 70  Weight: 156 lb 6.4 oz (70.9 kg)    Fetal Status:      Movement: Present    General: Alert, oriented and cooperative. Patient is in no acute distress.  Skin: Skin is warm and dry. No rash noted.   Cardiovascular: Normal heart rate noted  Respiratory: Normal respiratory effort, no problems with respiration noted  Abdomen: Soft, gravid, appropriate for gestational age.  Pain/Pressure: Absent     Pelvic: Cervical exam deferred        Extremities: Normal range of motion.  Edema: None (pt reports some swelling in the face in the mornings)  Mental Status: Normal mood and affect. Normal behavior. Normal judgment and thought content.   Assessment and Plan:  Pregnancy: G3P1011 at [redacted]w[redacted]d 1. [redacted] weeks gestation of pregnancy (Primary) - MFM US  scheduled - Blood Pressure Monitoring (BLOOD PRESSURE KIT) DEVI; 1 Device by Does not apply route daily.  Dispense: 1 each; Refill: 0 - Cervicovaginal ancillary only - Babyscripts Schedule Optimization - Enroll Patient in  PreNatal Babyscripts - Pt declined AFP today - RTO in 4 weeks for return prenatal visit  Preterm labor symptoms and general obstetric precautions including but not limited to vaginal bleeding, contractions, leaking of fluid and fetal movement were reviewed in detail with the patient. Please refer to After Visit Summary for other counseling recommendations.   Return for Return in 4 weeks for prenatal visit, 8 weeks prenatal visit with 2hr GTT, 12 weeks prenatal visit .  Future Appointments  Date Time Provider Department Center  07/15/2024  3:10 PM Diona Perkins, MD [facility]  07/27/2024  9:55 AM Cleotilde Ronal RAMAN, MD [facility] [id number]  08/10/2024  1:00 PM [facility] PROVIDER 1 [facility]  08/10/2024  1:30 PM [facility] US5 [facility]  08/26/2024  8:30 AM [facility] OBGYN LAB [facility] [address]  08/26/2024  8:55 AM Yu Peggs, [name] POUR, CNM [facility] [phone] [address]    [name] POUR Roller, CNM

## 2024-07-04 ENCOUNTER — Ambulatory Visit (HOSPITAL_BASED_OUTPATIENT_CLINIC_OR_DEPARTMENT_OTHER): Payer: Self-pay | Admitting: Certified Nurse Midwife

## 2024-07-04 LAB — CERVICOVAGINAL ANCILLARY ONLY
Chlamydia: NEGATIVE
Comment: NEGATIVE
Comment: NORMAL
Neisseria Gonorrhea: NEGATIVE

## 2024-07-08 ENCOUNTER — Telehealth: Payer: Self-pay

## 2024-07-08 DIAGNOSIS — R21 Rash and other nonspecific skin eruption: Secondary | ICD-10-CM

## 2024-07-08 NOTE — Telephone Encounter (Signed)
 Patient called asking if her doctor could please send a referral for dermatology please. Wants it sent to [facility], and says they told her to mark it as urgent so she can get in sooner.

## 2024-07-11 ENCOUNTER — Telehealth: Payer: Self-pay

## 2024-07-11 ENCOUNTER — Telehealth (HOSPITAL_BASED_OUTPATIENT_CLINIC_OR_DEPARTMENT_OTHER): Payer: Self-pay

## 2024-07-11 NOTE — Telephone Encounter (Signed)
 Pt called and stated that she has decreased fetal movement and wants to know what to do. Pt also wants to know why her ultrasound was cancelled. Pt is 18 wks 4 days.

## 2024-07-11 NOTE — Telephone Encounter (Signed)
 Spoke with patient using a [place] interpreter Dawn Kennedy ([phone]) Patient stated that she have this rash around her mouth that's been there for 8 months. She states that its no itching,burning or pain around that area. She stated she contacted the [facility] derm office and they told her that she would need a referral and to mark it urgent.

## 2024-07-11 NOTE — Telephone Encounter (Signed)
 Notes: Today's call was focused on checking in on the resources that had been provided by client request or by the needs assessed during the Guided Conversation. Client expressed concern for baby movement immediately upon answering, disclosing that she has called her OB (switched to [facility]) at 8:30am with no call back. CN advised her to call again, give [facility] a call since she is still an established patient to see if she can get some advice or to go to the hospital if she feel it is warranted. Quickly asked ct if she had connected with resources previously provided - reports not yet connecting. Encouraged her to do so before ending call.  Breslin Burklow Waddell Community Navigator [facility] [place] Direct Dial: [phone]

## 2024-07-11 NOTE — Addendum Note (Signed)
 Addended by: LORRANE PAC on: 07/11/2024 10:12 AM   Modules accepted: Orders

## 2024-07-12 ENCOUNTER — Other Ambulatory Visit (HOSPITAL_COMMUNITY)
Admission: RE | Admit: 2024-07-12 | Discharge: 2024-07-12 | Disposition: A | Discharge status: Home / Self Care | Source: Ambulatory Visit | Attending: Obstetrics & Gynecology | Admitting: Obstetrics & Gynecology

## 2024-07-12 ENCOUNTER — Ambulatory Visit (HOSPITAL_BASED_OUTPATIENT_CLINIC_OR_DEPARTMENT_OTHER): Admitting: Obstetrics & Gynecology

## 2024-07-12 VITALS — BP 104/74 | HR 88 | Wt 156.8 lb

## 2024-07-12 DIAGNOSIS — N898 Other specified noninflammatory disorders of vagina: Secondary | ICD-10-CM

## 2024-07-12 DIAGNOSIS — O368120 Decreased fetal movements, second trimester, not applicable or unspecified: Secondary | ICD-10-CM

## 2024-07-12 DIAGNOSIS — O99891 Other specified diseases and conditions complicating pregnancy: Secondary | ICD-10-CM

## 2024-07-12 DIAGNOSIS — O26892 Other specified pregnancy related conditions, second trimester: Secondary | ICD-10-CM

## 2024-07-12 DIAGNOSIS — O09292 Supervision of pregnancy with other poor reproductive or obstetric history, second trimester: Secondary | ICD-10-CM | POA: Diagnosis not present

## 2024-07-12 DIAGNOSIS — Z8632 Personal history of gestational diabetes: Secondary | ICD-10-CM

## 2024-07-12 DIAGNOSIS — Z3A18 18 weeks gestation of pregnancy: Secondary | ICD-10-CM

## 2024-07-12 DIAGNOSIS — O26899 Other specified pregnancy related conditions, unspecified trimester: Secondary | ICD-10-CM

## 2024-07-12 DIAGNOSIS — Z6791 Unspecified blood type, Rh negative: Secondary | ICD-10-CM

## 2024-07-12 DIAGNOSIS — Z3402 Encounter for supervision of normal first pregnancy, second trimester: Secondary | ICD-10-CM

## 2024-07-12 NOTE — Progress Notes (Unsigned)
   PRENATAL VISIT NOTE  Subjective:  Dawn Kennedy is a 33 y.o. G3P1011 at 184d being seen today for ongoing prenatal care.  She is currently monitored for the following issues for this low-risk pregnancy and has Patellofemoral pain syndrome of both knees; Supervision of low-risk first pregnancy, second trimester; and Rh negative state in antepartum period on their problem list.  Patient reports several concerns.  First she is not feeling movement like she did last week.  Second she is having increased vaginal discharge.  No STI concerns.  We also discussed her h/o GDM.  She is checking blood sugars and the fastings she reports are between 90-100 so not meeting goals for diet control.  Lastly anatomy scan was cancelled and not rescheduled.  Has questions about this.  Contractions: Not present. Vag. Bleeding: None.  Movement: (!) Decreased. Denies leaking of fluid.   The following portions of the patient's history were reviewed and updated as appropriate: allergies, current medications, past family history, past medical history, past social history, past surgical history and problem list.   Objective:   Vitals:   07/12/24 1435  BP: 104/74  Pulse: 88  Weight: 156 lb 12.8 oz (71.1 kg)    Fetal Status: Fetal Heart Rate (bpm): 153    Movement: (!) Decreased    General:  Alert, oriented and cooperative. Patient is in no acute distress.  Skin: Skin is warm and dry. No rash noted.   Cardiovascular: Normal heart rate noted  Respiratory: Normal respiratory effort, no problems with respiration noted  Abdomen: Soft, gravid, appropriate for gestational age.  Pain/Pressure: Present (pelvic pressure)     Pelvic: Cervical exam deferred        Extremities: Normal range of motion.  Edema: None  Mental Status: Normal mood and affect. Normal behavior. Normal judgment and thought content.   Assessment and Plan:  Pregnancy: G3P1011 at [redacted]w[redacted]d 1. Supervision of low-risk first pregnancy, second trimester  (Primary) - on PNV - will work to get anatomy scan rescheduled.    2. [redacted] weeks gestation of pregnancy - recheck 2 weeks  3. Decreased fetal movements in second trimester, single or unspecified fetus - advised regular movement is not consistent at this point.  Her description of fetal movement is not worrisome.  Normal heart rate noted today.    4. Vaginal discharge - Cervicovaginal ancillary only( [place])  5. History of gestational diabetes mellitus (GDM) - fasting blood sugars that this pt has right now are not in control.  She is going to test fasting and 2 hh pp and return with these at her next visit.  If not controlled, will start treatment.    6. Rh negative state in antepartum period  Preterm labor symptoms and general obstetric precautions including but not limited to vaginal bleeding, contractions, leaking of fluid and fetal movement were reviewed in detail with the patient. Please refer to After Visit Summary for other counseling recommendations.   Return in about 2 weeks (around 07/26/2024).  Future Appointments  Date Time Provider Department Center  07/27/2024  9:55 AM Cleotilde Ronal RAMAN, MD [facility] [address]  08/26/2024  8:30 AM [facility] OBGYN LAB [facility] [address]  08/26/2024  8:55 AM Lo, [name] POUR, CNM [facility] [address]  09/05/2024  2:10 PM Caudle, Thersia Bitters, FNP [facility] [id number]  01/04/2025  1:45 PM Alm Delon SAILOR, DO [facility] None    Ronal RAMAN Cleotilde, MD

## 2024-07-12 NOTE — Telephone Encounter (Signed)
 Patient scheduled for an appointment today, 07/12/2024 at 2:35 pm with Dr. Cleotilde.   Morna LOISE Quale, RN

## 2024-07-13 ENCOUNTER — Encounter: Payer: Self-pay | Admitting: Dermatology

## 2024-07-13 ENCOUNTER — Ambulatory Visit: Admitting: Dermatology

## 2024-07-13 VITALS — BP 95/64

## 2024-07-13 DIAGNOSIS — L710 Perioral dermatitis: Secondary | ICD-10-CM

## 2024-07-13 LAB — CERVICOVAGINAL ANCILLARY ONLY
Bacterial Vaginitis (gardnerella): NEGATIVE
Candida Glabrata: NEGATIVE
Candida Vaginitis: POSITIVE — AB
Comment: NEGATIVE
Comment: NEGATIVE
Comment: NEGATIVE

## 2024-07-13 MED ORDER — AZELAIC ACID 15 % EX GEL: 1.0000 | Freq: Every evening | CUTANEOUS | 5 refills | Status: DC

## 2024-07-13 MED ORDER — CLINDAMYCIN PHOSPHATE 1 % EX SWAB: 1.0000 | Freq: Every day | CUTANEOUS | 5 refills | Status: AC

## 2024-07-13 NOTE — Progress Notes (Signed)
   New Patient Visit   Subjective  Dawn Kennedy is a 33 y.o. female who presents for a NEW PATIENT appointment to be examined for the concerns as listed below.   Dermatitis: Pt noticed skin changes around the mouth about 8 months ago. She was seen by PCP and Rx hydrocortisone  1% and metronidazole  0.75% to apply but it did not improved noting that metronidazole  made the areas worse with redness but the area is not painful or itchy.    Are you nursing, pregnant or trying to conceive? Yes, pregnant, due March 2026.    No Hx of Bx.  No family Hx of skin cancer.   The following portions of the chart were reviewed this encounter and updated as appropriate: medications, allergies, medical history  Review of Systems:  No other skin or systemic complaints except as noted in HPI or Assessment and Plan.  Objective  Well appearing patient in no apparent distress; mood and affect are within normal limits.   A focused examination was performed of the following areas: mouth    Relevant exam findings are noted in the Assessment and Plan.     Assessment & Plan   Perioral Dermatitis Chronic perioral dermatitis with worsening symptoms, characterized by perioral papules coalescing into plaques. The condition has persisted for over eight months and has worsened during pregnancy due to hormonal changes. It is not associated with pain or itching. Previous episodes resolved without treatment. Retinoid use was identified as a potential exacerbating factor, contraindicated during pregnancy. The condition may take 4-6 months to clear without oral antibiotics, which are avoided during pregnancy to prevent potential harm to the fetus.  - Discontinue use of retinol and retinoid products immediately. - Use Avene gel cleanser for face washing morning and night. - Apply clindamycin swab in the morning followed by Avene Tolerance Moisturizer. - At night, apply azelaic acid 15% after washing the face,  followed by Cicalfate cream. - Provide samples of Avene products for trial use. - Send prescriptions for azelaic acid and clindamycin to pharmacy. - Advise that the condition may take 4-6 months to clear without oral antibiotics. - Schedule follow-up appointment in five months to assess progress.   Return in about 5 months (around 12/11/2024) for perioral dermatitis.   Documentation: I have reviewed the above documentation for accuracy and completeness, and I agree with the above.  I, Shirron Maranda, CMA, am acting as scribe for [employer], DO.   Delon Lenis, DO

## 2024-07-13 NOTE — Patient Instructions (Addendum)
 VISIT SUMMARY:  Today, we discussed your worsening perioral dermatitis, which has been aggravated by your pregnancy. We reviewed your symptoms and current skincare routine, and I provided a new treatment plan to help manage your condition.  YOUR PLAN:  -PERIORAL DERMATITIS:  Perioral dermatitis is a facial rash that tends to occur around the mouth. It is characterized by red bumps and can be aggravated by certain skincare products and hormonal changes, such as those during pregnancy.  To manage this condition, you should STOP using retinol and retinoid products immediately. Instead, use Avene gel cleanser to wash your face in the morning and at night.  In the morning, apply a clindamycin swab followed by Avene Tolerance Moisturizer.  At night, after washing your face, apply azelaic acid 15% cream followed by Cicalfate cream. Samples of Avene products have been provided for you to try.  Prescriptions for azelaic acid and clindamycin have been sent to your pharmacy. Please note that it may take 4-6 months for your condition to clear without oral antibiotics, which we are avoiding during your pregnancy.  INSTRUCTIONS:  Please schedule a follow-up appointment in five months to assess your progress.    Important Information  Due to recent changes in healthcare laws, you may see results of your pathology and/or laboratory studies on MyChart before the doctors have had a chance to review them. We understand that in some cases there may be results that are confusing or concerning to you. Please understand that not all results are received at the same time and often the doctors may need to interpret multiple results in order to provide you with the best plan of care or course of treatment. Therefore, we ask that you please give us  2 business days to thoroughly review all your results before contacting the office for clarification. Should we see a critical lab result, you will be contacted  sooner.   If You Need Anything After Your Visit  If you have any questions or concerns for your doctor, please call our main line at [phone] If no one answers, please leave a voicemail as directed and we will return your call as soon as possible. Messages left after 4 pm will be answered the following business day.   You may also send us  a message via MyChart. We typically respond to MyChart messages within 1-2 business days.  For prescription refills, please ask your pharmacy to contact our office. Our fax number is [phone].  If you have an urgent issue when the clinic is closed that cannot wait until the next business day, you can page your doctor at the number below.    Please note that while we do our best to be available for urgent issues outside of office hours, we are not available 24/7.   If you have an urgent issue and are unable to reach us , you may choose to seek medical care at your doctor's office, retail clinic, urgent care center, or emergency room.  If you have a medical emergency, please immediately call 911 or go to the emergency department. In the event of inclement weather, please call our main line at [phone] for an update on the status of any delays or closures.  Dermatology Medication Tips: Please keep the boxes that topical medications come in in order to help keep track of the instructions about where and how to use these. Pharmacies typically print the medication instructions only on the boxes and not directly on the medication tubes.   If your  medication is too expensive, please contact our office at [phone] or send us  a message through MyChart.   We are unable to tell what your co-pay for medications will be in advance as this is different depending on your insurance coverage. However, we may be able to find a substitute medication at lower cost or fill out paperwork to get insurance to cover a needed medication.   If a prior authorization is  required to get your medication covered by your insurance company, please allow us  1-2 business days to complete this process.  Drug prices often vary depending on where the prescription is filled and some pharmacies may offer cheaper prices.  The website www.goodrx.com contains coupons for medications through different pharmacies. The prices here do not account for what the cost may be with help from insurance (it may be cheaper with your insurance), but the website can give you the price if you did not use any insurance.  - You can print the associated coupon and take it with your prescription to the pharmacy.  - You may also stop by our office during regular business hours and pick up a GoodRx coupon card.  - If you need your prescription sent electronically to a different pharmacy, notify our office through [facility] or by phone at [phone]

## 2024-07-14 ENCOUNTER — Ambulatory Visit (HOSPITAL_BASED_OUTPATIENT_CLINIC_OR_DEPARTMENT_OTHER): Payer: Self-pay | Admitting: Obstetrics & Gynecology

## 2024-07-15 ENCOUNTER — Encounter: Payer: Self-pay | Admitting: Family Medicine

## 2024-07-15 ENCOUNTER — Encounter (HOSPITAL_COMMUNITY): Payer: Self-pay

## 2024-07-15 ENCOUNTER — Other Ambulatory Visit (HOSPITAL_BASED_OUTPATIENT_CLINIC_OR_DEPARTMENT_OTHER): Payer: Self-pay

## 2024-07-15 ENCOUNTER — Ambulatory Visit (HOSPITAL_COMMUNITY)
Admission: EM | Admit: 2024-07-15 | Discharge: 2024-07-15 | Disposition: A | Discharge status: Home / Self Care | Attending: Internal Medicine | Admitting: Internal Medicine

## 2024-07-15 DIAGNOSIS — Z3A19 19 weeks gestation of pregnancy: Secondary | ICD-10-CM | POA: Diagnosis not present

## 2024-07-15 DIAGNOSIS — O99512 Diseases of the respiratory system complicating pregnancy, second trimester: Secondary | ICD-10-CM | POA: Diagnosis not present

## 2024-07-15 DIAGNOSIS — J069 Acute upper respiratory infection, unspecified: Secondary | ICD-10-CM

## 2024-07-15 DIAGNOSIS — J0110 Acute frontal sinusitis, unspecified: Secondary | ICD-10-CM | POA: Diagnosis not present

## 2024-07-15 MED ORDER — TERCONAZOLE 0.4 % VA CREA: 1.0000 | TOPICAL_CREAM | Freq: Every day | VAGINAL | 0 refills | Status: AC

## 2024-07-15 MED ORDER — GUAIFENESIN ER 600 MG PO TB12: 600.0000 mg | ORAL_TABLET | Freq: Two times a day (BID) | ORAL | 0 refills | Status: DC

## 2024-07-15 MED ORDER — AMOXICILLIN-POT CLAVULANATE 875-125 MG PO TABS
1.0000 | ORAL_TABLET | Freq: Two times a day (BID) | ORAL | 0 refills | Status: DC
Start: 2024-07-15 — End: 2024-07-31

## 2024-07-15 NOTE — ED Provider Notes (Signed)
 [facility]    CSN: [id number] Arrival date & time: 07/15/24  1137      History   Chief Complaint Chief Complaint  Patient presents with   Cough    HPI Dawn Kennedy is a 33 y.o. female.   Dawn Kennedy is a 33 y.o. female presenting for chief complaint of Cough, nasal congestion, and generalized fatigue that started approximately 8 to 9 days ago.  Her daughter is now sick with similar symptoms.  Cough is minimally productive with clear phlegm.  Nasal congestion is yellow/white.  She is currently [redacted] weeks pregnant.  She denies any concerning symptoms related to the pregnancy such as vaginal bleeding/urinary symptoms.  She has had chills without known fever at home.  Denies shortness of breath, chest pain, heart palpitations, dizziness, abdominal pain, nausea, vomiting, diarrhea, and ear pain.  Denies recent antibiotic or steroid use.  History of bacterial sinus infection, states this feels very similar.  She has been using Tylenol  at home with minimal relief of symptoms.   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 Completed treatment 6 mo isoniazide + B6 in summer 2022 confirmed by Madelin Pollack at [facility]     Obstetric vaginal laceration with type 3a third degree perineal laceration 04/27/2023   Patellofemoral syndrome of right knee 03/07/2021   Patellofemoral syndrome of right knee 03/07/2021   Seborrheic dermatitis 03/07/2021   Well woman exam (no gynecological exam) 11/22/2021    Patient Active Problem List   Diagnosis Date Noted   Rh negative state in antepartum period 06/19/2024   Supervision of low-risk first pregnancy, second trimester 04/29/2024   Patellofemoral pain syndrome of both knees 03/07/2021    Past  Surgical History:  Procedure Laterality Date   NO PAST SURGERIES      OB History     Gravida  3   Para  1   Term  1   Preterm      AB  1   Living  1      SAB  0   IAB  1   Ectopic      Multiple  0   Live Births  1            Home Medications    Prior to Admission medications   Medication Sig Start Date End Date Taking? Authorizing Provider  guaiFENesin (MUCINEX) 600 MG 12 hr tablet Take 1 tablet (600 mg total) by mouth 2 (two) times daily. 07/15/24  Yes Enedelia Dorna HERO, FNP  Azelaic Acid 15 % gel Apply 1 Application topically at bedtime. Apply to face. 07/13/24   Alm Delon SAILOR, DO  Blood Pressure Monitoring (BLOOD PRESSURE KIT) DEVI 1 Device by Does not apply route daily. 07/01/24   Lo, Donna K, CNM  clindamycin (CLEOCIN T) 1 % SWAB Apply 1 Application topically daily. 07/13/24   Alm Delon SAILOR, DO  Prenatal Vit-Fe Fumarate-FA (MULTIVITAMIN-PRENATAL) 27-0.8 MG TABS tablet Take 1 tablet by mouth daily. 04/14/24   Lupie Credit, DO    Family History History reviewed. No pertinent family history.  Social History Social History   Tobacco Use   Smoking status: Never   Smokeless tobacco: Never  Vaping Use   Vaping status: Never Used  Substance Use Topics   Alcohol use: Never   Drug use: Never     Allergies   Patient has no known allergies.   Review of Systems Review of Systems  Respiratory:  Positive for cough.   Per HPI   Physical Exam Triage Vital Signs ED Triage Vitals  Encounter Vitals Group     BP 07/15/24 1254 99/67     Girls Systolic BP Percentile --      Girls Diastolic BP Percentile --      Boys Systolic BP Percentile --      Boys Diastolic BP Percentile --      Pulse Rate 07/15/24 1254 87     Resp 07/15/24 1254 16     Temp 07/15/24 1254 98.1 F (36.7 C)     Temp Source 07/15/24 1254 Oral     SpO2 07/15/24 1254 97 %     Weight --      Height --      Head Circumference --      Peak Flow --      Pain Score  07/15/24 1252 5     Pain Loc --      Pain Education --      Exclude from Growth Chart --    No data found.  Updated Vital Signs BP 99/67 (BP Location: Left Arm)   Pulse 87   Temp 98.1 F (36.7 C) (Oral)   Resp 16   LMP 03/04/2024 (Exact Date)   SpO2 97%   Breastfeeding No   Visual Acuity Right Eye Distance:   Left Eye Distance:   Bilateral Distance:    Right Eye Near:   Left Eye Near:    Bilateral Near:     Physical Exam Vitals and nursing note reviewed.  Constitutional:      Appearance: She is not ill-appearing or toxic-appearing.  HENT:     Head: Normocephalic and atraumatic.     Right Ear: Hearing, tympanic membrane, ear canal and external ear normal.     Left Ear: Hearing, tympanic membrane, ear canal and external ear normal.     Nose: Congestion present.     Mouth/Throat:     Lips: Pink.     Mouth: Mucous membranes are moist. No injury or oral lesions.     Dentition: Normal dentition.     Tongue: No lesions.     Pharynx: Oropharynx is clear. Uvula midline. No pharyngeal swelling, oropharyngeal exudate, posterior oropharyngeal erythema, uvula swelling or postnasal drip.     Tonsils: No tonsillar exudate.  Eyes:     General: Lids are normal. Vision grossly intact. Gaze aligned appropriately.     Extraocular Movements: Extraocular movements intact.     Conjunctiva/sclera: Conjunctivae normal.  Neck:     Trachea: Trachea and phonation normal.  Cardiovascular:     Rate and Rhythm: Normal rate and regular rhythm.     Heart sounds: Normal heart sounds, S1 normal and S2 normal.  Pulmonary:     Effort: Pulmonary effort is normal. No respiratory distress.     Breath sounds: Normal breath sounds and air entry.  Abdominal:     Comments: Gravid abdomen.  Musculoskeletal:     Cervical back: Neck supple.  Lymphadenopathy:     Cervical: No cervical adenopathy.  Skin:    General: Skin is warm and dry.     Capillary Refill: Capillary refill takes less than 2 seconds.      Findings: No rash.  Neurological:  General: No focal deficit present.     Mental Status: She is alert and oriented to person, place, and time. Mental status is at baseline.     Cranial Nerves: No dysarthria or facial asymmetry.  Psychiatric:        Mood and Affect: Mood normal.        Speech: Speech normal.        Behavior: Behavior normal.        Thought Content: Thought content normal.        Judgment: Judgment normal.      UC Treatments / Results  Labs (all labs ordered are listed, but only abnormal results are displayed) Labs Reviewed - No data to display  EKG   Radiology No results found.  Procedures Procedures (including critical care time)  Medications Ordered in UC Medications - No data to display  Initial Impression / Assessment and Plan / UC Course  I have reviewed the triage vital signs and the nursing notes.  Pertinent labs & imaging results that were available during my care of the patient were reviewed by me and considered in my medical decision making (see chart for details).   1.  Viral URI with cough, diseases of the respiratory system complicating pregnancy in second trimester, [redacted] weeks gestation of pregnancy Presentation is consistent with acute postviral bacterial sinusitis.   Symptoms have been present for greater than 8-9 days and have not responded well to over-the-counter therapies, therefore may have antibiotic.  Prescriptions for further symptomatic relief sent, may continue using OTC medications as needed.  Patient given list of medications safe in pregnancy. Recommend use of guaifenesin in addition to Augmentin antibiotic to treat bacterial sinusitis.  Follow-up with OB/GYN for ongoing prenatal care.  Counseled patient on potential for adverse effects with medications prescribed/recommended today, strict ER and return-to-clinic precautions discussed, patient verbalized understanding.    Final Clinical Impressions(s) / UC Diagnoses    Final diagnoses:  [redacted] weeks gestation of pregnancy  Diseases of the respiratory system complicating pregnancy, second trimester  Viral URI with cough     Discharge Instructions      Your symptoms are due to a virus in your body which will get better over the next several days with use of medicines to help with your symptoms.  Continue taking tylenol  500mg  every 6 hours as needed for body aches.   Take guaifenesin (mucinex) every 12 hours as needed for nasal congestion.   Use non-medicated saline nasal sprays as needed for nasal congestion.   If you develop any new or worsening symptoms or if your symptoms do not start to improve, please return here or follow-up with your primary care provider. If your symptoms are severe, please go to the emergency room.    ED Prescriptions     Medication Sig Dispense Auth. Provider   guaiFENesin (MUCINEX) 600 MG 12 hr tablet Take 1 tablet (600 mg total) by mouth 2 (two) times daily. 30 tablet Enedelia Dorna HERO, FNP      PDMP not reviewed this encounter.   Enedelia Dorna HERO, [place] 07/15/24 1450

## 2024-07-15 NOTE — ED Triage Notes (Signed)
 Patient here today with c/o cough, ST, headache, runny nose, and fatigue since this past Saturday. Patient has been taking Tylenol  with no relief. Patient's daughter also started having some of the same symptoms 3 days ago.

## 2024-07-15 NOTE — Telephone Encounter (Signed)
 Spoke with patient via interpreter services (interpreter ID [phone]).

## 2024-07-15 NOTE — Discharge Instructions (Signed)
 Your symptoms are due to a virus in your body which will get better over the next several days with use of medicines to help with your symptoms.  Continue taking tylenol  500mg  every 6 hours as needed for body aches.   Take guaifenesin (mucinex) every 12 hours as needed for nasal congestion.   Use non-medicated saline nasal sprays as needed for nasal congestion.   If you develop any new or worsening symptoms or if your symptoms do not start to improve, please return here or follow-up with your primary care provider. If your symptoms are severe, please go to the emergency room.

## 2024-07-27 ENCOUNTER — Other Ambulatory Visit (HOSPITAL_COMMUNITY)
Admission: RE | Admit: 2024-07-27 | Discharge: 2024-07-27 | Disposition: A | Discharge status: Home / Self Care | Source: Ambulatory Visit | Attending: Obstetrics & Gynecology | Admitting: Obstetrics & Gynecology

## 2024-07-27 ENCOUNTER — Ambulatory Visit (HOSPITAL_BASED_OUTPATIENT_CLINIC_OR_DEPARTMENT_OTHER): Admitting: Obstetrics & Gynecology

## 2024-07-27 VITALS — BP 111/66 | HR 83 | Wt 162.4 lb

## 2024-07-27 DIAGNOSIS — N898 Other specified noninflammatory disorders of vagina: Secondary | ICD-10-CM | POA: Insufficient documentation

## 2024-07-27 DIAGNOSIS — O99712 Diseases of the skin and subcutaneous tissue complicating pregnancy, second trimester: Secondary | ICD-10-CM

## 2024-07-27 DIAGNOSIS — O09293 Supervision of pregnancy with other poor reproductive or obstetric history, third trimester: Secondary | ICD-10-CM

## 2024-07-27 DIAGNOSIS — Z8632 Personal history of gestational diabetes: Secondary | ICD-10-CM

## 2024-07-27 DIAGNOSIS — O26899 Other specified pregnancy related conditions, unspecified trimester: Secondary | ICD-10-CM

## 2024-07-27 DIAGNOSIS — O99891 Other specified diseases and conditions complicating pregnancy: Secondary | ICD-10-CM

## 2024-07-27 DIAGNOSIS — O26892 Other specified pregnancy related conditions, second trimester: Secondary | ICD-10-CM | POA: Diagnosis not present

## 2024-07-27 DIAGNOSIS — Z6791 Unspecified blood type, Rh negative: Secondary | ICD-10-CM | POA: Diagnosis not present

## 2024-07-27 DIAGNOSIS — Z3402 Encounter for supervision of normal first pregnancy, second trimester: Secondary | ICD-10-CM

## 2024-07-27 DIAGNOSIS — Z789 Other specified health status: Secondary | ICD-10-CM

## 2024-07-27 DIAGNOSIS — Z3A20 20 weeks gestation of pregnancy: Secondary | ICD-10-CM

## 2024-07-27 DIAGNOSIS — L710 Perioral dermatitis: Secondary | ICD-10-CM

## 2024-07-27 MED ORDER — BLOOD GLUCOSE TEST VI STRP: ORAL_STRIP | 5 refills | Status: DC

## 2024-07-27 MED ORDER — LANCETS MISC: 1.0000 | 5 refills | Status: AC

## 2024-07-27 NOTE — Progress Notes (Signed)
 PRENATAL VISIT NOTE  Subjective:  Dawn Kennedy is a 33 y.o. G3P1011 at [redacted]w[redacted]d being seen today for ongoing prenatal care.  She is currently monitored for the following issues for this low-risk pregnancy and has Patellofemoral pain syndrome of both knees; Supervision of low-risk first pregnancy, second trimester; Rh negative state in antepartum period; Perioral dermatitis; and History of diet controlled gestational diabetes mellitus (GDM) on their problem list.  Patient reports continued vaginal itching.  Discussed evaluation today.  Needs BP cuff sent to different pharmacy.  H/O GDM.  Failed early 1 hr GTT and passed 3 hour.  Will need to repeat around 26 -28 weeks.  Has been checking blood sugars and these are all good.  Advised to decrease to just fasting and occasional 2 hr pp.  Needs rx for lancets and strips.  Also, saw Dr. Alm.  Has dermatitis on face that has gotten worse.  Will reach out and ask for sooner appt for pt.  Contractions: Not present. Vag. Bleeding: None.  Movement: Present (very minimal). Denies leaking of fluid.   The following portions of the patient's history were reviewed and updated as appropriate: allergies, current medications, past family history, past medical history, past social history, past surgical history and problem list.   Objective:   Vitals:   07/27/24 0954  BP: 111/66  Pulse: 83  Weight: 162 lb 6.4 oz (73.7 kg)    Fetal Status:  Fetal Heart Rate (bpm): 149   Movement: Present (very minimal)    General: Alert, oriented and cooperative. Patient is in no acute distress.  Skin: Skin is warm and dry. No rash noted.   Cardiovascular: Normal heart rate noted  Respiratory: Normal respiratory effort, no problems with respiration noted  Abdomen: Soft, gravid, appropriate for gestational age.  Pain/Pressure: Absent     Pelvic: Cervical exam deferred        Extremities: Normal range of motion.  Edema: None  Mental Status: Normal mood and affect. Normal  behavior. Normal judgment and thought content.      07/01/2024   10:13 AM 07/01/2024    8:41 AM 06/17/2024    1:39 PM  Depression screen PHQ 2/9  Decreased Interest 0 0 0  Down, Depressed, Hopeless 0 0 0  PHQ - 2 Score 0 0 0  Altered sleeping 0 0 0  Tired, decreased energy 0 0 0  Change in appetite 0 0 0  Feeling bad or failure about yourself  0 0 0  Trouble concentrating 0 0 0  Moving slowly or fidgety/restless 0 0 0  Suicidal thoughts 0 0 0  PHQ-9 Score 0  0  0   Difficult doing work/chores  Not difficult at all      Data saved with a previous flowsheet row definition        07/01/2024    8:42 AM  GAD 7 : Generalized Anxiety Score  Nervous, Anxious, on Edge 0  Control/stop worrying 0  Worry too much - different things 0  Trouble relaxing 0  Restless 0  Easily annoyed or irritable 0  Afraid - awful might happen 0  Total GAD 7 Score 0  Anxiety Difficulty Not difficult at all    Assessment and Plan:  Pregnancy: G3P1011 at [redacted]w[redacted]d 1. Supervision of low-risk first pregnancy, second trimester (Primary) - on PNV - blood pressure cuff provided to pt - Babyscripts Schedule Optimization  2. [redacted] weeks gestation of pregnancy - anatomy scan scheduled 07/2023  3. Rh negative state in  antepartum period - baby is Rh negative with NIPS testing  4. Non-English speaking patient - Armed forces training and education officer present for entire visit  5. Vaginal itching - Cervicovaginal ancillary only  6. History of diet controlled gestational diabetes mellitus (GDM) - pt checking blood sugars but needs new rx for lancets and strips.    7.  Perioral dermatitis - has seen Dr. Alm during pregnancy.  Current medications prescribed makes face burn and worsen symptoms.  Message sent to Dr. Alm to see if pt can be seen much sooner than April which is when her follow up is scheduled.    Preterm labor symptoms and general obstetric precautions including but not limited to vaginal bleeding, contractions,  leaking of fluid and fetal movement were reviewed in detail with the patient. Please refer to After Visit Summary for other counseling recommendations.   Return in about 4 weeks (around 08/24/2024).  Future Appointments  Date Time Provider Department Center  08/15/2024  2:00 PM [facility] PROVIDER 1 [facility]  08/15/2024  2:30 PM [facility] US2 [facility]  08/26/2024  8:30 AM [facility] [id number]  08/26/2024  8:55 AM Lo, [name] POUR, CNM [facility] [id number]  09/05/2024  2:10 PM Caudle, Thersia Bitters, FNP [facility] [id number]  09/13/2024  2:35 PM Lo, [name] POUR, CNM [facility] [id number]  09/30/2024 10:55 AM Lo, [name] POUR, CNM [facility] [address]  10/14/2024 10:55 AM Lo, [name] POUR, CNM [facility] [id number]  10/28/2024  9:35 AM Lo, [name] POUR, CNM [facility] [id number]  11/11/2024 10:15 AM Lo, [name] POUR, CNM [facility] [id number]  01/04/2025  1:45 PM Alm Delon SAILOR, DO [facility] None    Dawn GORMAN Pinal, MD   .

## 2024-07-27 NOTE — Progress Notes (Deleted)
 Swab  Strips and lancets Dr. Alm Blood sugars Anatomy scan

## 2024-07-28 LAB — CERVICOVAGINAL ANCILLARY ONLY
Bacterial Vaginitis (gardnerella): NEGATIVE
Candida Glabrata: NEGATIVE
Candida Vaginitis: NEGATIVE
Comment: NEGATIVE
Comment: NEGATIVE
Comment: NEGATIVE

## 2024-07-31 ENCOUNTER — Ambulatory Visit (HOSPITAL_BASED_OUTPATIENT_CLINIC_OR_DEPARTMENT_OTHER): Payer: Self-pay | Admitting: Obstetrics & Gynecology

## 2024-07-31 DIAGNOSIS — Z8632 Personal history of gestational diabetes: Secondary | ICD-10-CM | POA: Insufficient documentation

## 2024-07-31 DIAGNOSIS — L710 Perioral dermatitis: Secondary | ICD-10-CM | POA: Insufficient documentation

## 2024-08-04 ENCOUNTER — Encounter (HOSPITAL_BASED_OUTPATIENT_CLINIC_OR_DEPARTMENT_OTHER): Payer: Self-pay

## 2024-08-10 ENCOUNTER — Other Ambulatory Visit

## 2024-08-10 ENCOUNTER — Ambulatory Visit

## 2024-08-15 ENCOUNTER — Ambulatory Visit: Attending: Family Medicine | Admitting: Obstetrics

## 2024-08-15 ENCOUNTER — Ambulatory Visit (HOSPITAL_BASED_OUTPATIENT_CLINIC_OR_DEPARTMENT_OTHER)

## 2024-08-15 ENCOUNTER — Other Ambulatory Visit: Payer: Self-pay | Admitting: *Deleted

## 2024-08-15 VITALS — BP 115/67

## 2024-08-15 DIAGNOSIS — O09292 Supervision of pregnancy with other poor reproductive or obstetric history, second trimester: Secondary | ICD-10-CM | POA: Diagnosis not present

## 2024-08-15 DIAGNOSIS — O09293 Supervision of pregnancy with other poor reproductive or obstetric history, third trimester: Secondary | ICD-10-CM | POA: Insufficient documentation

## 2024-08-15 DIAGNOSIS — Z3A23 23 weeks gestation of pregnancy: Secondary | ICD-10-CM | POA: Diagnosis not present

## 2024-08-15 DIAGNOSIS — Z363 Encounter for antenatal screening for malformations: Secondary | ICD-10-CM | POA: Insufficient documentation

## 2024-08-15 DIAGNOSIS — O24410 Gestational diabetes mellitus in pregnancy, diet controlled: Secondary | ICD-10-CM

## 2024-08-15 DIAGNOSIS — O360193 Maternal care for anti-D [Rh] antibodies, unspecified trimester, fetus 3: Secondary | ICD-10-CM | POA: Insufficient documentation

## 2024-08-15 DIAGNOSIS — Z3491 Encounter for supervision of normal pregnancy, unspecified, first trimester: Secondary | ICD-10-CM | POA: Diagnosis not present

## 2024-08-15 NOTE — Progress Notes (Signed)
 MFM Consult Note  Dawn Kennedy is currently at 101w3d. She was seen today for a detailed fetal anatomy scan due to a short interval pregnancy.    She has a history of diet-controlled gestational diabetes in her prior pregnancy. She recently failed her 1 hour glucose challenge test but passed her 3-hour glucose tolerance test.   She denies any problems in her current pregnancy.    She had a cell free DNA test earlier in her pregnancy which indicated a low risk for trisomy 20, 2, and 13. A female fetus is predicted.   Sonographic findings Single intrauterine pregnancy at 23w 3d. Fetal cardiac activity:  Observed and appears normal. Presentation: Breech. The anatomic structures that were well seen appear normal. Due to poor acoustic windows some structures remain suboptimally visualized. Fetal biometry shows the estimated fetal weight of 1 lb 7 oz, 644 grams (67%). Amniotic fluid: Within normal limits.  MVP: 5.81 cm. Placenta: Anterior. Adnexa: No abnormality visualized. Cervical length: 3.4 cm.  Today's exam was based on a review of the images obtained by the sonographer only.  Due to her religious beliefs, I was not allowed to scan her myself.  The patient was informed that anomalies may be missed due to technical limitations. If the fetus is in a suboptimal position or maternal habitus is increased, visualization of the fetus in the maternal uterus may be impaired.  Due to her short interval pregnancy and the increased risk of IUGR, a follow-up exam was scheduled in 7 weeks.    She should be screened for gestational diabetes again at 28 weeks.  All conversations were held with the patient today with the help of a [place] interpreter.  The patient stated that all of her questions were answered.   A total of 25 minutes was spent counseling and coordinating the care for this patient.  Greater than 50% of the time was spent in direct face-to-face contact.

## 2024-08-24 ENCOUNTER — Encounter (HOSPITAL_BASED_OUTPATIENT_CLINIC_OR_DEPARTMENT_OTHER): Payer: Self-pay

## 2024-08-26 ENCOUNTER — Ambulatory Visit (INDEPENDENT_AMBULATORY_CARE_PROVIDER_SITE_OTHER): Payer: Self-pay | Admitting: Certified Nurse Midwife

## 2024-08-26 ENCOUNTER — Other Ambulatory Visit (HOSPITAL_BASED_OUTPATIENT_CLINIC_OR_DEPARTMENT_OTHER)

## 2024-08-26 VITALS — BP 90/63 | HR 68 | Wt 165.4 lb

## 2024-08-26 DIAGNOSIS — Z6731 Type AB blood, Rh negative: Secondary | ICD-10-CM

## 2024-08-26 DIAGNOSIS — Z3A25 25 weeks gestation of pregnancy: Secondary | ICD-10-CM | POA: Diagnosis not present

## 2024-08-26 DIAGNOSIS — Z3480 Encounter for supervision of other normal pregnancy, unspecified trimester: Secondary | ICD-10-CM

## 2024-08-26 DIAGNOSIS — Z3482 Encounter for supervision of other normal pregnancy, second trimester: Secondary | ICD-10-CM | POA: Diagnosis not present

## 2024-08-26 MED ORDER — ASPIRIN 81 MG PO TBEC: 81.0000 mg | DELAYED_RELEASE_TABLET | Freq: Every day | ORAL | 12 refills | Status: AC

## 2024-08-26 MED ORDER — RHO D IMMUNE GLOBULIN 1500 UNIT/2ML IJ SOSY
300.0000 ug | PREFILLED_SYRINGE | Freq: Once | INTRAMUSCULAR | Status: DC
  Filled 2024-08-26: qty 2

## 2024-08-26 NOTE — Progress Notes (Signed)
 US  (08/15/24): Breech, anterior placenta, AFI wnl, EFW 1lb 7oz (67%), CL 3.4cm, anatomic scan incomplete,     PRENATAL VISIT NOTE  Subjective:  Dawn Kennedy is a 33 y.o. G3P1011 at [redacted]w[redacted]d being seen today for ongoing prenatal care.  She is currently monitored for the following issues for this  pregnancy and has Patellofemoral pain syndrome of both knees; Supervision of low-risk first pregnancy, second trimester; Rh negative state in antepartum period; Perioral dermatitis; and History of diet controlled gestational diabetes mellitus (GDM) on their problem list.  Patient reports no complaints.  Contractions: Not present. Vag. Bleeding: None.  Movement: Present. Denies leaking of fluid.   The following portions of the patient's history were reviewed and updated as appropriate: allergies, current medications, past family history, past medical history, past social history, past surgical history and problem list.   Objective:   Vitals:   08/26/24 0850  BP: 90/63  Pulse: 68  Weight: 165 lb 6.4 oz (75 kg)    Fetal Status:  Fetal Heart Rate (bpm): 140 Fundal Height: 25 cm Movement: Present    General: Alert, oriented and cooperative. Patient is in no acute distress.  Skin: Skin is warm and dry. No rash noted.   Cardiovascular: Normal heart rate noted  Respiratory: Normal respiratory effort, no problems with respiration noted  Abdomen: Soft, gravid, appropriate for gestational age.  Pain/Pressure: Present (Pain in vagina)     Pelvic: Cervical exam deferred        Extremities: Normal range of motion.  Edema: None  Mental Status: Normal mood and affect. Normal behavior. Normal judgment and thought content.      07/01/2024   10:13 AM 07/01/2024    8:41 AM 06/17/2024    1:39 PM  Depression screen PHQ 2/9  Decreased Interest 0 0 0  Down, Depressed, Hopeless 0 0 0  PHQ - 2 Score 0 0 0  Altered sleeping 0 0 0  Tired, decreased energy 0 0 0  Change in appetite 0 0 0  Feeling bad or  failure about yourself  0 0 0  Trouble concentrating 0 0 0  Moving slowly or fidgety/restless 0 0 0  Suicidal thoughts 0 0 0  PHQ-9 Score 0  0  0   Difficult doing work/chores  Not difficult at all      Data saved with a previous flowsheet row definition        07/01/2024    8:42 AM  GAD 7 : Generalized Anxiety Score  Nervous, Anxious, on Edge 0  Control/stop worrying 0  Worry too much - different things 0  Trouble relaxing 0  Restless 0  Easily annoyed or irritable 0  Afraid - awful might happen 0  Total GAD 7 Score 0  Anxiety Difficulty Not difficult at all    Assessment and Plan:  Pregnancy: G3P1011 at [redacted]w[redacted]d  1. Supervision of other normal pregnancy, antepartum (Primary) - 2hr GTT and labs today - Offer Tdap at next return prenatal visit - Taking PNV and ASA 81mg  po once daily  2. [redacted] weeks gestation of pregnancy  3. Type AB blood, Rh negative - Rhogam not indicated (Baby Rh Negative on Panorama)  Preterm labor symptoms and general obstetric precautions including but not limited to vaginal bleeding, contractions, leaking of fluid and fetal movement were reviewed in detail with the patient. Please refer to After Visit Summary for other counseling recommendations.    Future Appointments  Date Time Provider Department Center  09/05/2024  2:10 PM Caudle,  Thersia Bitters, FNP [facility] [address]  09/13/2024  2:35 PM Aliha Diedrich, [name] POUR, CNM [facility] [address]  09/29/2024 10:55 AM Delores Nidia CROME, FNP [facility] [address]  10/03/2024  8:15 AM [facility] PROVIDER 1 [facility]  10/03/2024  8:30 AM [facility] US2 [facility]  10/14/2024 10:35 AM Cleotilde Ronal RAMAN, MD [facility] [address]  10/28/2024  9:35 AM Declynn Lopresti, [name] POUR, CNM [facility] [address]  11/11/2024 10:15 AM Sahaj Bona, [name] POUR, CNM [facility] [address]  01/04/2025  1:45 PM Alm Delon SAILOR, DO [facility] None    [name] POUR Roller, CNM

## 2024-08-27 LAB — SYPHILIS: RPR W/REFLEX TO RPR TITER AND TREPONEMAL ANTIBODIES, TRADITIONAL SCREENING AND DIAGNOSIS ALGORITHM: RPR Ser Ql: NONREACTIVE

## 2024-08-27 LAB — CBC
Hematocrit: 38.9 % (ref 34.0–46.6)
Hemoglobin: 12.6 g/dL (ref 11.1–15.9)
MCH: 29.6 pg (ref 26.6–33.0)
MCHC: 32.4 g/dL (ref 31.5–35.7)
MCV: 92 fL (ref 79–97)
Platelets: 304 x10E3/uL (ref 150–450)
RBC: 4.25 x10E6/uL (ref 3.77–5.28)
RDW: 12.6 % (ref 11.7–15.4)
WBC: 12.5 x10E3/uL — ABNORMAL HIGH (ref 3.4–10.8)

## 2024-08-27 LAB — GLUCOSE TOLERANCE, 2 HOURS W/ 1HR
Glucose, 1 hour: 198 mg/dL — ABNORMAL HIGH (ref 70–179)
Glucose, 2 hour: 155 mg/dL — ABNORMAL HIGH (ref 70–152)
Glucose, Fasting: 84 mg/dL (ref 70–91)

## 2024-08-27 LAB — ANTIBODY SCREEN: Antibody Screen: NEGATIVE

## 2024-08-27 LAB — HIV ANTIBODY (ROUTINE TESTING W REFLEX): HIV Screen 4th Generation wRfx: NONREACTIVE

## 2024-08-31 ENCOUNTER — Ambulatory Visit (HOSPITAL_BASED_OUTPATIENT_CLINIC_OR_DEPARTMENT_OTHER): Payer: Self-pay | Admitting: Certified Nurse Midwife

## 2024-08-31 DIAGNOSIS — O24419 Gestational diabetes mellitus in pregnancy, unspecified control: Secondary | ICD-10-CM | POA: Insufficient documentation

## 2024-08-31 NOTE — Progress Notes (Signed)
 Please let Dawn Kennedy know (with Dari interpreter) that her results indicate she has Gestational Diabetes. I think the patient currently has a meter and supplies. Ask her to please start checking her blood sugars fasting and 2 hours after breakfast/lunch/dinner. Please ask her to bring a log of her blood sugars with her to her next appointment.  [name] MARLA Roller

## 2024-09-05 ENCOUNTER — Ambulatory Visit (HOSPITAL_BASED_OUTPATIENT_CLINIC_OR_DEPARTMENT_OTHER): Admitting: Family Medicine

## 2024-09-13 ENCOUNTER — Ambulatory Visit (HOSPITAL_BASED_OUTPATIENT_CLINIC_OR_DEPARTMENT_OTHER): Admitting: Certified Nurse Midwife

## 2024-09-13 VITALS — BP 103/55 | HR 83 | Wt 165.8 lb

## 2024-09-13 DIAGNOSIS — O24410 Gestational diabetes mellitus in pregnancy, diet controlled: Secondary | ICD-10-CM

## 2024-09-13 DIAGNOSIS — Z3402 Encounter for supervision of normal first pregnancy, second trimester: Secondary | ICD-10-CM

## 2024-09-13 DIAGNOSIS — Z3A27 27 weeks gestation of pregnancy: Secondary | ICD-10-CM

## 2024-09-13 DIAGNOSIS — Z3480 Encounter for supervision of other normal pregnancy, unspecified trimester: Secondary | ICD-10-CM

## 2024-09-13 NOTE — Progress Notes (Signed)
 "   PRENATAL VISIT NOTE  Subjective:  Dawn Kennedy is a 33 y.o. G3P1011 at [redacted]w[redacted]d being seen today for ongoing prenatal care.  She is currently monitored for the following issues for this low-risk pregnancy and has Patellofemoral pain syndrome of both knees; Supervision of low-risk first pregnancy, second trimester; Rh negative state in antepartum period; Perioral dermatitis; History of diet controlled gestational diabetes mellitus (GDM); and Gestational diabetes mellitus (GDM) in second trimester on their problem list.  Patient reports no complaints.  Contractions: Not present. Vag. Bleeding: None.  Movement: Present. Denies leaking of fluid.   The following portions of the patient's history were reviewed and updated as appropriate: allergies, current medications, past family history, past medical history, past social history, past surgical history and problem list.   Objective:   Vitals:   09/13/24 1451  BP: (!) 103/55  Pulse: 83  Weight: 165 lb 12.8 oz (75.2 kg)    Fetal Status:      Movement: Present    General: Alert, oriented and cooperative. Patient is in no acute distress.  Skin: Skin is warm and dry. No rash noted.   Cardiovascular: Normal heart rate noted  Respiratory: Normal respiratory effort, no problems with respiration noted  Abdomen: Soft, gravid, appropriate for gestational age.  Pain/Pressure: Present     Pelvic: Cervical exam deferred        Extremities: Normal range of motion.  Edema: None  Mental Status: Normal mood and affect. Normal behavior. Normal judgment and thought content.      07/01/2024   10:13 AM 07/01/2024    8:41 AM 06/17/2024    1:39 PM  Depression screen PHQ 2/9  Decreased Interest 0 0 0  Down, Depressed, Hopeless 0 0 0  PHQ - 2 Score 0 0 0  Altered sleeping 0 0 0  Tired, decreased energy 0 0 0  Change in appetite 0 0 0  Feeling bad or failure about yourself  0 0 0  Trouble concentrating 0 0 0  Moving slowly or fidgety/restless 0 0 0   Suicidal thoughts 0 0 0  PHQ-9 Score 0  0  0   Difficult doing work/chores  Not difficult at all      Data saved with a previous flowsheet row definition        07/01/2024    8:42 AM  GAD 7 : Generalized Anxiety Score  Nervous, Anxious, on Edge 0  Control/stop worrying 0  Worry too much - different things 0  Trouble relaxing 0  Restless 0  Easily annoyed or irritable 0  Afraid - awful might happen 0  Total GAD 7 Score 0  Anxiety Difficulty Not difficult at all    Assessment and Plan:  Pregnancy: G3P1011 at [redacted]w[redacted]d  1. Supervision of other normal pregnancy, antepartum (Primary) - Pt eating healthy diet, staying active, monitoring blood sugars - Rhogam not indicated  2. [redacted] weeks gestation of pregnancy - Rhogam not indicated  3. Diet controlled gestational diabetes mellitus (GDM) in second trimester - Pt brought blood sugar log. See media. Majority of fastings within goal of <90, all 2 hour postprandial fsbs within goal.    Preterm labor symptoms and general obstetric precautions including but not limited to vaginal bleeding, contractions, leaking of fluid and fetal movement were reviewed in detail with the patient. Please refer to After Visit Summary for other counseling recommendations.    Future Appointments  Date Time Provider Department Center  09/29/2024 10:55 AM Dawn Nidia CROME, FNP [facility]  [address]  10/03/2024  8:15 AM [facility] PROVIDER 1 [facility]  10/03/2024  8:30 AM [facility] US2 [facility]  10/14/2024 10:35 AM Dawn Ronal RAMAN, MD [facility] [address]  10/28/2024  9:55 AM Dawn Ronal RAMAN, MD [facility] [address]  11/11/2024 10:15 AM Dawn Jorene BRAVO, PA-C [facility] [address]  01/04/2025  1:45 PM Dawn Delon SAILOR, DO [facility] None    Dawn Kennedy, CNM  "

## 2024-09-14 LAB — COMPREHENSIVE METABOLIC PANEL WITH GFR
ALT: 12 IU/L (ref 0–32)
AST: 12 IU/L (ref 0–40)
Albumin: 3.7 g/dL — ABNORMAL LOW (ref 3.9–4.9)
Alkaline Phosphatase: 74 IU/L (ref 41–116)
BUN/Creatinine Ratio: 18 (ref 9–23)
BUN: 8 mg/dL (ref 6–20)
Bilirubin Total: <0.2 mg/dL (ref 0.0–1.2)
CO2: 20 mmol/L (ref 20–29)
Calcium: 9.2 mg/dL (ref 8.7–10.2)
Chloride: 101 mmol/L (ref 96–106)
Creatinine, Ser: 0.44 mg/dL — ABNORMAL LOW (ref 0.57–1.00)
Globulin, Total: 2.9 g/dL (ref 1.5–4.5)
Glucose: 100 mg/dL — ABNORMAL HIGH (ref 70–99)
Potassium: 4.3 mmol/L (ref 3.5–5.2)
Sodium: 137 mmol/L (ref 134–144)
Total Protein: 6.6 g/dL (ref 6.0–8.5)
eGFR: 131 mL/min/1.73

## 2024-09-16 ENCOUNTER — Encounter (HOSPITAL_BASED_OUTPATIENT_CLINIC_OR_DEPARTMENT_OTHER): Admitting: Certified Nurse Midwife

## 2024-09-20 ENCOUNTER — Ambulatory Visit (HOSPITAL_BASED_OUTPATIENT_CLINIC_OR_DEPARTMENT_OTHER): Payer: Self-pay | Admitting: Certified Nurse Midwife

## 2024-09-29 ENCOUNTER — Encounter (HOSPITAL_BASED_OUTPATIENT_CLINIC_OR_DEPARTMENT_OTHER): Admitting: Obstetrics and Gynecology

## 2024-09-30 ENCOUNTER — Encounter (HOSPITAL_BASED_OUTPATIENT_CLINIC_OR_DEPARTMENT_OTHER): Payer: Self-pay | Admitting: Certified Nurse Midwife

## 2024-10-03 ENCOUNTER — Other Ambulatory Visit: Payer: Self-pay | Admitting: *Deleted

## 2024-10-03 ENCOUNTER — Emergency Department (HOSPITAL_BASED_OUTPATIENT_CLINIC_OR_DEPARTMENT_OTHER)
Admission: EM | Admit: 2024-10-03 | Discharge: 2024-10-03 | Disposition: A | Discharge status: Home / Self Care | Attending: Emergency Medicine | Admitting: Emergency Medicine

## 2024-10-03 ENCOUNTER — Ambulatory Visit: Attending: Obstetrics and Gynecology | Admitting: Obstetrics

## 2024-10-03 ENCOUNTER — Ambulatory Visit

## 2024-10-03 VITALS — BP 98/55 | HR 82

## 2024-10-03 DIAGNOSIS — R040 Epistaxis: Secondary | ICD-10-CM | POA: Diagnosis present

## 2024-10-03 DIAGNOSIS — Z7982 Long term (current) use of aspirin: Secondary | ICD-10-CM | POA: Diagnosis not present

## 2024-10-03 DIAGNOSIS — Z362 Encounter for other antenatal screening follow-up: Secondary | ICD-10-CM | POA: Insufficient documentation

## 2024-10-03 DIAGNOSIS — O24410 Gestational diabetes mellitus in pregnancy, diet controlled: Secondary | ICD-10-CM

## 2024-10-03 DIAGNOSIS — O360130 Maternal care for anti-D [Rh] antibodies, third trimester, not applicable or unspecified: Secondary | ICD-10-CM

## 2024-10-03 DIAGNOSIS — O09293 Supervision of pregnancy with other poor reproductive or obstetric history, third trimester: Secondary | ICD-10-CM | POA: Insufficient documentation

## 2024-10-03 DIAGNOSIS — O360190 Maternal care for anti-D [Rh] antibodies, unspecified trimester, not applicable or unspecified: Secondary | ICD-10-CM | POA: Diagnosis not present

## 2024-10-03 DIAGNOSIS — Z3A30 30 weeks gestation of pregnancy: Secondary | ICD-10-CM | POA: Insufficient documentation

## 2024-10-03 DIAGNOSIS — R0981 Nasal congestion: Secondary | ICD-10-CM | POA: Insufficient documentation

## 2024-10-03 DIAGNOSIS — O09893 Supervision of other high risk pregnancies, third trimester: Secondary | ICD-10-CM

## 2024-10-03 MED ORDER — OXYMETAZOLINE HCL 0.05 % NA SOLN
1.0000 | Freq: Once | NASAL | Status: AC
  Administered 2024-10-03: 1 via NASAL
  Filled 2024-10-03: qty 30

## 2024-10-03 NOTE — Discharge Instructions (Signed)
 Recommend buying nasal saline spray to use once or twice a day to help keep some moisture in your nose.  Recommend taking hot shower with some humidity as well.  If you do have another nosebleed.  I recommend you hold direct pressure for 10 minutes as we discussed.  If that does not work then I recommend using 1 spray of Afrin in your affected nose and hold pressure again for 10 minutes.  If that does not stop your nosebleed please return for evaluation.  I would not use Afrin unless its to stop nosebleed and I would only use it for 1 attempt.

## 2024-10-03 NOTE — ED Provider Notes (Signed)
 " [place] EMERGENCY DEPARTMENT AT [facility] Provider Note   CSN: [id number] Arrival date & time: 10/03/24  1940     Patient presents with: Epistaxis   Dawn Kennedy is a 34 y.o. female.   Patient is here with some intermittent nosebleeds from the left nare.  Has stopped with direct pressure.  She has had some nasal congestion.  Some dryness.  She is about [redacted] weeks pregnant.  She is not having any contractions.  7 good fetal movement.  No vaginal bleeding or discharge.  No headaches.  No other symptoms.  Is not on any blood thinners.  She has no bleeding history.  The history is provided by the patient.       Prior to Admission medications  Medication Sig Start Date End Date Taking? Authorizing Provider  aspirin  EC 81 MG tablet Take 1 tablet (81 mg total) by mouth daily. Swallow whole. 08/26/24   Lo, [name] POUR, CNM  Blood Pressure Monitoring (BLOOD PRESSURE KIT) DEVI 1 Device by Does not apply route daily. 07/01/24   Lo, [name] POUR, CNM  clindamycin  (CLEOCIN  T) 1 % SWAB Apply 1 Application topically daily. 07/13/24   Alm Delon SAILOR, DO  Glucose Blood (BLOOD GLUCOSE TEST STRIPS) STRP Take BS fasting and 2 hours pp.  Pt uses Accu Check Guide Me monitor Patient not taking: Reported on 08/15/2024 07/27/24   Cleotilde Ronal RAMAN, MD  Lancets MISC 1 each by Does not apply route as directed. Take BS fasting and 2 hours pp.  Pt uses Accu Check Guide Me glucose monitor 07/27/24   Cleotilde Ronal RAMAN, MD  Prenatal Vit-Fe Fumarate-FA (MULTIVITAMIN-PRENATAL) 27-0.8 MG TABS tablet Take 1 tablet by mouth daily. 04/14/24   Lupie Credit, DO    Allergies: Patient has no known allergies.    Review of Systems  Updated Vital Signs BP 109/70 (BP Location: Right Arm)   Pulse 83   Temp 98 F (36.7 C)   Resp 16   LMP 03/04/2024 (Exact Date)   SpO2 98%   Physical Exam Vitals and nursing note reviewed.  Constitutional:      General: She is not in acute distress.    Appearance: She is  well-developed.  HENT:     Head: Normocephalic and atraumatic.     Nose: Nose normal.     Comments: Some mild congestion of the turbinates bilaterally but there is no nosebleed    Mouth/Throat:     Mouth: Mucous membranes are moist.  Eyes:     Extraocular Movements: Extraocular movements intact.     Conjunctiva/sclera: Conjunctivae normal.     Pupils: Pupils are equal, round, and reactive to light.  Cardiovascular:     Rate and Rhythm: Normal rate and regular rhythm.     Heart sounds: No murmur heard. Pulmonary:     Effort: Pulmonary effort is normal. No respiratory distress.     Breath sounds: Normal breath sounds.  Abdominal:     Palpations: Abdomen is soft.     Tenderness: There is no abdominal tenderness.  Musculoskeletal:        General: No swelling.     Cervical back: Neck supple.  Skin:    General: Skin is warm and dry.     Capillary Refill: Capillary refill takes less than 2 seconds.  Neurological:     Mental Status: She is alert.  Psychiatric:        Mood and Affect: Mood normal.     (all labs ordered are listed,  but only abnormal results are displayed) Labs Reviewed - No data to display  EKG: None  Radiology: US  MFM OB FOLLOW UP Result Date: 10/03/2024 ----------------------------------------------------------------------  OBSTETRICS REPORT                       (Signed Final 10/03/2024 09:38 am) ---------------------------------------------------------------------- Patient Info  ID #:       [id number]                          D.O.B.:  1990-11-24 (33 yrs)(F)  Name:       [name]                  Visit Date: 10/03/2024 08:26 am ---------------------------------------------------------------------- Performed By  Attending:        Steffan Keys MD         Ref. Address:     [address]                                                             [address]                                                              [place], [place]                                                             [address]  Performed By:     Mardy Heller          [facility]  Referred By:      [facility]                    [place] [facility]                    [place] ---------------------------------------------------------------------- Orders  #  Description                           Code        Ordered By  1  US  MFM OB FOLLOW UP                   [phone]    BABARA KEYS ----------------------------------------------------------------------  #  Order #                     Accession #                Episode #  1  [id number]                   [id number]                 [id number] ---------------------------------------------------------------------- Indications  Gestational diabetes in pregnancy, diet        O24.410  controlled  Encounter for other antenatal screening        Z36.2  follow-up  Rh negative state in antepartum                O36.0190  Poor obstetric history: Previous gestational   O09.299  diabetes  LR female  [redacted] weeks gestation of pregnancy                Z3A.30 ---------------------------------------------------------------------- Fetal Evaluation  Num Of Fetuses:         1  Fetal Heart Rate(bpm):  148  Cardiac Activity:       Observed  Presentation:           Cephalic  Placenta:               Anterior  P. Cord Insertion:      Visualized, central  Amniotic Fluid  AFI FV:      Within normal limits  AFI Sum(cm)     %Tile       Largest Pocket(cm)  19.27           74          5.95  RUQ(cm)       RLQ(cm)       LUQ(cm)        LLQ(cm)  5.77          4.34          5.95           3.21 ---------------------------------------------------------------------- Biometry  BPD:      78.2  mm     G. Age:  31w 3d          68  %    CI:        67.78   %    70 - 86                                                          FL/HC:  18.9   %    19.2 - 21.4  HC:      303.9  mm     G. Age:  33w 6d         94  %    HC/AC:      1.04        0.99 - 1.21  AC:      292.8  mm     G. Age:  33w 2d         98  %    FL/BPD:     73.5   %    71 - 87  FL:       57.5  mm     G. Age:  30w 1d         27  %    FL/AC:      19.6   %    20 - 24  HUM:      50.5  mm     G. Age:  29w 4d         35  %  LV:        3.1  mm  Est. FW:    1931  gm      4 lb 4 oz     92  % ---------------------------------------------------------------------- OB History  Gravidity:    3         Term:   1  TOP:          1        Living:  1 ---------------------------------------------------------------------- Gestational Age  LMP:           30w 3d        Date:  03/04/24                  EDD:   12/09/24  U/S Today:     32w 1d                                        EDD:   11/27/24  Best:          30w 3d     Det. By:  LMP  (03/04/24)          EDD:   12/09/24 ---------------------------------------------------------------------- Anatomy  Cranium:               Appears normal         Aortic Arch:            Appears normal  Cavum:                 Appears normal         Ductal Arch:            Previously seen  Ventricles:            Appears normal         Diaphragm:              Appears normal  Choroid Plexus:        Previously seen        Stomach:                Appears normal, left  sided  Cerebellum:            Appears normal         Abdomen:                Appears normal  Posterior Fossa:       Appears normal         Abdominal Wall:         Previously seen  Nuchal Fold:           Previously seen        Cord Vessels:           Previously seen  Face:                  Orbits and profile     Kidneys:                Appear normal                         previously seen  Lips:                  Appears normal         Bladder:                 Appears normal  Thoracic:              Appears normal         Spine:                  Previously seen  Heart:                 Previously seen        Upper Extremities:      Previously seen  RVOT:                  Appears normal         Lower Extremities:      Previously seen  LVOT:                  Appears normal  Other:  Fetal anatomic survey complete on prior scans. Fetus appears to be          female. ---------------------------------------------------------------------- Cervix Uterus Adnexa  Cervix  Length:            4.3  cm.  Normal appearance by transabdominal scan  Uterus  No abnormality visualized.  Right Ovary  Within normal limits.  Left Ovary  Within normal limits.  Adnexa  No abnormality visualized ---------------------------------------------------------------------- Comments  Sonographic findings  Single intrauterine pregnancy at 30w 3d.  Fetal cardiac activity:  Observed and appears normal.  Presentation: Cephalic.  Fetal biometry shows the estimated fetal weight of 4 lb 4 oz,  1931g (92%).  Amniotic fluid volume: Within normal limits. AFI: 19.27cm.  MVP: 5.95 cm.  Placenta: Anterior.  There are limitations of prenatal ultrasound such as the  inability to detect certain abnormalities due to poor  visualization. Various factors such as fetal position,  gestational age and maternal body habitus may increase the  difficulty in visualizing the fetal anatomy.  Recommendations  - See Epic note for assessment and plan of care. Any  referring office that does not utilize Epic will recieve a copy of  today's consult note via fax. Please contact our office with  any concerns. ----------------------------------------------------------------------  Steffan Keys, MD Electronically Signed Final Report   10/03/2024 09:38 am ----------------------------------------------------------------------     Procedures   Medications Ordered in the ED  oxymetazoline  (AFRIN) 0.05 % nasal spray  1 spray (1 spray Each Nare Given 10/03/24 2052)                                    Medical Decision Making Risk OTC drugs.   Jadesola Bahr is here with left-sided nosebleed.  Normal vitals.  Normal blood pressure.  There is no longer any bleeding.  Bleeding has stopped with direct pressure.  I recommend that she continue to do this if she has other bleeds.  I do think that this is from nasal congestion in pregnancy and may be from the dry air given this time a year.  I recommend warm showers in the morning to help get some humidity in the nasal passages.  Recommend nasal saline spray.  I recommend that if nosebleeds happen again I showed her precisely how to hold pressure in her nose for 10 minutes.  If that did not resolve the bleeding that she should do 1 spray of Afrin up her nose.  If that does not stop her bleeding she should come for evaluation.  She should not overuse the Afrin given that she is pregnant but I think she can use it once a day to try to stop nosebleeds.  She understands to return if symptoms worsen.  Her fetal heart rate was normal with 145.  Does not have any pregnancy concerns.  I do not think she needs any pregnancy monitoring at this time.  She is asymptomatic.  Does not have any bleeding.  She has had unremarkable pregnancy.  She got normal vitals.  No concern for any bleeding issues otherwise.  Do not think she needs any lab work at this time.  She understands return precautions.  This chart was dictated using voice recognition software.  Despite best efforts to proofread,  errors can occur which can change the documentation meaning.      Final diagnoses:  Epistaxis    ED Discharge Orders     None          Ruthe Cornet, DO 10/03/24 2100  "

## 2024-10-03 NOTE — ED Triage Notes (Addendum)
 Nosebleed several times today. Stops easily with pressure. No bleeding on arrival. No thinners. +Headaches. No hx HTN.   [redacted] weeks pregnant.   EDP [place] consulted for triage.

## 2024-10-03 NOTE — Progress Notes (Signed)
 MFM Consult Note  Dawn Kennedy is currently at [redacted]w[redacted]d. She has been followed due to a short interval pregnancy.  She was recently diagnosed with diet-controlled gestational diabetes.    She reports that the majority of her fingerstick values have been within normal limits.    Her blood pressure today was 98/55.  Sonographic findings Single intrauterine pregnancy at 30w 3d.  Fetal cardiac activity:  Observed and appears normal. Presentation: Cephalic. Fetal biometry shows the estimated fetal weight of 4 lb 4 oz,  1931g (92%). Amniotic fluid volume: Within normal limits. AFI: 19.27cm.  MVP: 5.95 cm. Placenta: Anterior.  Gestational Diabetes   She was advised to continue to monitor her fingersticks 4 times daily (fasting and 2 hours after each meal).    She was advised that our goals for her fingerstick values are fasting values of 90-95 or less and two-hour postprandial values of 120 or less.    Should the majority (greater than 50%) of her fingerstick results be above these values, she may have to be started on insulin  or metformin  to help her achieve better glycemic control.   Due to gestational diabetes, we will continue to follow her with monthly growth ultrasounds.    Weekly fetal testing should be started at 32 weeks should she require insulin  or metformin  for treatment.  The patient was advised that should a large for gestational age fetus continue to be noted later in her pregnancy, delivery will be recommended at between 37 to 38 weeks.  A follow up exam was scheduled in 4 weeks.   The patient stated that all of her questions were answered.   All conversations were held with the patient today with the help of a Dari interpreter.  A total of 20 minutes was spent counseling and coordinating the care for this patient.  Greater than 50% of the time was spent in direct face-to-face contact.

## 2024-10-10 ENCOUNTER — Telehealth (HOSPITAL_BASED_OUTPATIENT_CLINIC_OR_DEPARTMENT_OTHER): Payer: Self-pay

## 2024-10-10 ENCOUNTER — Encounter (HOSPITAL_BASED_OUTPATIENT_CLINIC_OR_DEPARTMENT_OTHER): Payer: Self-pay

## 2024-10-10 NOTE — Telephone Encounter (Signed)
 Letter created and printed. Faxed to provided fax number  Morna LOISE Quale, RN

## 2024-10-10 NOTE — Telephone Encounter (Incomplete)
 What dental office are you calling from? *** [facility]   What is your office phone number? [phone]   What is your fax number?***[phone]  What type of procedure is the patient having performed? *** Xrays  What date is procedure scheduled or is the patient there now? *** Yes   What is your question (ex. Antibiotics prior to procedure, holding medication-we need to know how long dentist wants pt to hold med)? clearance    (If the patient is currently at the dentist's office, call Pre-Op APP to address. If the patient is not currently in the dentist office, please route to the Pre-Op pool)

## 2024-10-14 ENCOUNTER — Other Ambulatory Visit: Payer: Self-pay

## 2024-10-14 ENCOUNTER — Ambulatory Visit (INDEPENDENT_AMBULATORY_CARE_PROVIDER_SITE_OTHER): Admitting: Obstetrics & Gynecology

## 2024-10-14 VITALS — BP 104/65 | HR 87 | Wt 168.0 lb

## 2024-10-14 DIAGNOSIS — Z3490 Encounter for supervision of normal pregnancy, unspecified, unspecified trimester: Secondary | ICD-10-CM

## 2024-10-14 DIAGNOSIS — O09893 Supervision of other high risk pregnancies, third trimester: Secondary | ICD-10-CM | POA: Diagnosis not present

## 2024-10-14 DIAGNOSIS — O0993 Supervision of high risk pregnancy, unspecified, third trimester: Secondary | ICD-10-CM

## 2024-10-14 DIAGNOSIS — O3663X0 Maternal care for excessive fetal growth, third trimester, not applicable or unspecified: Secondary | ICD-10-CM

## 2024-10-14 DIAGNOSIS — Z3A32 32 weeks gestation of pregnancy: Secondary | ICD-10-CM

## 2024-10-14 DIAGNOSIS — O24415 Gestational diabetes mellitus in pregnancy, controlled by oral hypoglycemic drugs: Secondary | ICD-10-CM | POA: Diagnosis not present

## 2024-10-14 DIAGNOSIS — Z6791 Unspecified blood type, Rh negative: Secondary | ICD-10-CM

## 2024-10-14 MED ORDER — METFORMIN HCL 500 MG PO TABS: 500.0000 mg | ORAL_TABLET | Freq: Two times a day (BID) | ORAL | 0 refills | Status: DC

## 2024-10-15 MED ORDER — DEXCOM G7 SENSOR MISC
3 refills | Status: AC
  Filled 2024-10-15: qty 3, 30d supply, fill #0

## 2024-10-16 ENCOUNTER — Other Ambulatory Visit: Payer: Self-pay

## 2024-10-16 NOTE — Progress Notes (Signed)
 "  PRENATAL VISIT NOTE  Subjective:  Dawn Kennedy is a 34 y.o. G3P1011 at [redacted]w[redacted]d being seen today for ongoing prenatal care.  She is currently monitored for the following issues for this high-risk pregnancy and has Patellofemoral pain syndrome of both knees; Supervision of low-risk first pregnancy, second trimester; Rh negative state in antepartum period; Perioral dermatitis; History of diet controlled gestational diabetes mellitus (GDM); and Gestational diabetes mellitus (GDM) in second trimester on their problem list.  Patient reports no complaints.  Contractions: Not present. Vag. Bleeding: None.  Movement: Present. Denies leaking of fluid.   brought log with her.  Fasting BSs are good.  Very few 2 hr pp are recorded but at least 1/2 of the ones that are are not within goal range.  Highest is 156.  Discussed importance of control.  Reports the times when they were elevated was when she ate something like rice.  Discussed better dietary choices.  Feel she would benefit from additional diabetic education.  Will reach out for assistance.    The following portions of the patient's history were reviewed and updated as appropriate: allergies, current medications, past family history, past medical history, past social history, past surgical history and problem list.   Objective:   Vitals:   10/14/24 1100  BP: 104/65  Pulse: 87  Weight: 168 lb (76.2 kg)    Fetal Status:  Fetal Heart Rate (bpm): 142 Fundal Height: 34 cm Movement: Present    General: Alert, oriented and cooperative. Patient is in no acute distress.  Skin: Skin is warm and dry. No rash noted.   Cardiovascular: Normal heart rate noted  Respiratory: Normal respiratory effort, no problems with respiration noted  Abdomen: Soft, gravid, appropriate for gestational age.  Pain/Pressure: Present (back pain)     Pelvic: Cervical exam deferred        Extremities: Normal range of motion.  Edema: None  Mental Status: Normal mood and  affect. Normal behavior. Normal judgment and thought content.      07/01/2024   10:13 AM 07/01/2024    8:41 AM 06/17/2024    1:39 PM  Depression screen PHQ 2/9  Decreased Interest 0 0 0  Down, Depressed, Hopeless 0 0 0  PHQ - 2 Score 0 0 0  Altered sleeping 0 0 0  Tired, decreased energy 0 0 0  Change in appetite 0 0 0  Feeling bad or failure about yourself  0 0 0  Trouble concentrating 0 0 0  Moving slowly or fidgety/restless 0 0 0  Suicidal thoughts 0 0 0  PHQ-9 Score 0  0  0   Difficult doing work/chores  Not difficult at all      Data saved with a previous flowsheet row definition        07/01/2024    8:42 AM  GAD 7 : Generalized Anxiety Score  Nervous, Anxious, on Edge 0   Control/stop worrying 0   Worry too much - different things 0   Trouble relaxing 0   Restless 0   Easily annoyed or irritable 0   Afraid - awful might happen 0   Total GAD 7 Score 0  Anxiety Difficulty Not difficult at all     Data saved with a previous flowsheet row definition    Assessment and Plan:  Pregnancy: G3P1011 at [redacted]w[redacted]d 1. Supervision of high-risk pregnancy, third trimester (Primary) - on PNV and baby ASA  2. [redacted] weeks gestation of pregnancy  3. Gestational diabetes mellitus (GDM) in  third trimester controlled on oral hypoglycemic drug - given elevated post prandial blood sugars, feel treatment should be initiated.  Pt declines insulin  although meal coverage would control BSs quickly and allow more flexibility in her diet.  Willing to take metformin .  Advised to start with 500mg  BID for 3-4 days and if no issues with GI distress, increase to 1gram BID  - metFORMIN  (GLUCOPHAGE ) 500 MG tablet; Take 1 tablet (500 mg total) by mouth 2 (two) times daily with a meal.  Dispense: 60 tablet; Refill: 0 - NST today reactive.  Will start twice weekly NSTs.  Already scheduled for end of week.  Given upcoming weather, will just have to monitor and see if can get her in for additional monitoring  next week - will send rx for CGM to pharmacy.  Prior authorization likely needed.    4. Rh negative state in antepartum period - baby Rh negative by NIPS  5. Excessive fetal growth affecting management of pregnancy in third trimester, single or unspecified fetus - repeat growth scheduled 11/04/2024 with MFM  Preterm labor symptoms and general obstetric precautions including but not limited to vaginal bleeding, contractions, leaking of fluid and fetal movement were reviewed in detail with the patient. Please refer to After Visit Summary for other counseling recommendations.   Return in about 1 week (around 10/21/2024).  Future Appointments  Date Time Provider Department Center  10/20/2024  9:35 AM Cleotilde Ronal RAMAN, MD [facility] [id number]  10/28/2024  9:55 AM Cleotilde Ronal RAMAN, MD [facility] [id number]  11/04/2024  3:15 PM [facility] PROVIDER 1 [facility]  11/04/2024  3:30 PM [facility] US1 [facility]  11/11/2024 10:15 AM Nicholaus Jorene BRAVO, PA-C [facility] [id number]  01/04/2025  1:45 PM Alm Delon SAILOR, DO [facility] None    Ronal RAMAN Cleotilde, MD  "

## 2024-10-20 ENCOUNTER — Other Ambulatory Visit (HOSPITAL_BASED_OUTPATIENT_CLINIC_OR_DEPARTMENT_OTHER): Payer: Self-pay

## 2024-10-20 ENCOUNTER — Ambulatory Visit (INDEPENDENT_AMBULATORY_CARE_PROVIDER_SITE_OTHER): Payer: Self-pay | Admitting: Obstetrics & Gynecology

## 2024-10-20 VITALS — BP 108/68 | HR 89 | Wt 168.0 lb

## 2024-10-20 DIAGNOSIS — Z3A32 32 weeks gestation of pregnancy: Secondary | ICD-10-CM | POA: Diagnosis not present

## 2024-10-20 DIAGNOSIS — O24415 Gestational diabetes mellitus in pregnancy, controlled by oral hypoglycemic drugs: Secondary | ICD-10-CM | POA: Diagnosis not present

## 2024-10-20 DIAGNOSIS — Z6791 Unspecified blood type, Rh negative: Secondary | ICD-10-CM | POA: Diagnosis not present

## 2024-10-20 DIAGNOSIS — O0993 Supervision of high risk pregnancy, unspecified, third trimester: Secondary | ICD-10-CM

## 2024-10-20 DIAGNOSIS — O26893 Other specified pregnancy related conditions, third trimester: Secondary | ICD-10-CM

## 2024-10-20 DIAGNOSIS — O26899 Other specified pregnancy related conditions, unspecified trimester: Secondary | ICD-10-CM

## 2024-10-20 DIAGNOSIS — Z3490 Encounter for supervision of normal pregnancy, unspecified, unspecified trimester: Secondary | ICD-10-CM

## 2024-10-20 DIAGNOSIS — Z8632 Personal history of gestational diabetes: Secondary | ICD-10-CM

## 2024-10-20 MED ORDER — PRENATAL 27-0.8 MG PO TABS: 1.0000 | ORAL_TABLET | Freq: Every day | ORAL | 6 refills | Status: AC

## 2024-10-20 MED ORDER — METFORMIN HCL 500 MG PO TABS: ORAL_TABLET | ORAL | 0 refills | Status: AC

## 2024-10-20 MED ORDER — BLOOD GLUCOSE TEST VI STRP: ORAL_STRIP | 5 refills | Status: AC

## 2024-10-28 ENCOUNTER — Encounter (HOSPITAL_BASED_OUTPATIENT_CLINIC_OR_DEPARTMENT_OTHER): Admitting: Obstetrics & Gynecology

## 2024-11-01 ENCOUNTER — Encounter (HOSPITAL_BASED_OUTPATIENT_CLINIC_OR_DEPARTMENT_OTHER): Payer: Self-pay | Admitting: Obstetrics and Gynecology

## 2024-11-04 ENCOUNTER — Ambulatory Visit: Admitting: Obstetrics and Gynecology

## 2024-11-04 ENCOUNTER — Other Ambulatory Visit

## 2024-11-04 ENCOUNTER — Ambulatory Visit

## 2024-11-11 ENCOUNTER — Encounter (HOSPITAL_BASED_OUTPATIENT_CLINIC_OR_DEPARTMENT_OTHER): Payer: Self-pay | Admitting: Certified Nurse Midwife

## 2024-11-11 ENCOUNTER — Ambulatory Visit: Admitting: Obstetrics and Gynecology

## 2024-11-18 ENCOUNTER — Ambulatory Visit: Admitting: Obstetrics and Gynecology

## 2024-12-02 ENCOUNTER — Ambulatory Visit: Admitting: Obstetrics and Gynecology

## 2025-01-04 ENCOUNTER — Ambulatory Visit: Admitting: Dermatology
